# Patient Record
Sex: Male | Born: 1980 | Race: White | Hispanic: No | Marital: Married | Smoking: Never smoker
Health system: Southern US, Community
[De-identification: ages and names within clinical notes are randomized; demographics above are authoritative.]

## PROBLEM LIST (undated history)

## (undated) DIAGNOSIS — Z9189 Other specified personal risk factors, not elsewhere classified: Secondary | ICD-10-CM

## (undated) DIAGNOSIS — Z872 Personal history of diseases of the skin and subcutaneous tissue: Secondary | ICD-10-CM

## (undated) HISTORY — DX: Other specified personal risk factors, not elsewhere classified: Z91.89

## (undated) HISTORY — DX: Personal history of diseases of the skin and subcutaneous tissue: Z87.2

---

## 2006-09-23 ENCOUNTER — Emergency Department (HOSPITAL_COMMUNITY): Admission: EM | Admit: 2006-09-23 | Discharge: 2006-09-24 | Payer: Self-pay | Admitting: Emergency Medicine

## 2006-09-23 ENCOUNTER — Emergency Department (HOSPITAL_COMMUNITY): Admission: EM | Admit: 2006-09-23 | Discharge: 2006-09-23 | Payer: Self-pay | Admitting: Emergency Medicine

## 2006-09-28 ENCOUNTER — Emergency Department (HOSPITAL_COMMUNITY): Admission: EM | Admit: 2006-09-28 | Discharge: 2006-09-28 | Payer: Self-pay | Admitting: Emergency Medicine

## 2006-09-29 ENCOUNTER — Encounter: Admission: RE | Admit: 2006-09-29 | Discharge: 2006-09-29 | Payer: Self-pay | Admitting: Emergency Medicine

## 2007-03-30 ENCOUNTER — Ambulatory Visit: Payer: Self-pay | Admitting: Internal Medicine

## 2007-03-30 DIAGNOSIS — Z872 Personal history of diseases of the skin and subcutaneous tissue: Secondary | ICD-10-CM

## 2007-03-30 DIAGNOSIS — J329 Chronic sinusitis, unspecified: Secondary | ICD-10-CM | POA: Insufficient documentation

## 2007-03-30 DIAGNOSIS — Z9189 Other specified personal risk factors, not elsewhere classified: Secondary | ICD-10-CM | POA: Insufficient documentation

## 2007-03-30 HISTORY — DX: Other specified personal risk factors, not elsewhere classified: Z91.89

## 2007-03-30 HISTORY — DX: Personal history of diseases of the skin and subcutaneous tissue: Z87.2

## 2007-04-08 ENCOUNTER — Encounter: Payer: Self-pay | Admitting: Internal Medicine

## 2007-06-14 ENCOUNTER — Telehealth: Payer: Self-pay | Admitting: Internal Medicine

## 2007-06-15 ENCOUNTER — Ambulatory Visit: Payer: Self-pay | Admitting: Internal Medicine

## 2007-06-15 DIAGNOSIS — H6980 Other specified disorders of Eustachian tube, unspecified ear: Secondary | ICD-10-CM | POA: Insufficient documentation

## 2007-11-09 ENCOUNTER — Ambulatory Visit: Payer: Self-pay | Admitting: Internal Medicine

## 2007-11-09 DIAGNOSIS — J069 Acute upper respiratory infection, unspecified: Secondary | ICD-10-CM | POA: Insufficient documentation

## 2007-11-09 DIAGNOSIS — J019 Acute sinusitis, unspecified: Secondary | ICD-10-CM | POA: Insufficient documentation

## 2007-12-23 ENCOUNTER — Ambulatory Visit: Payer: Self-pay | Admitting: Internal Medicine

## 2008-05-12 ENCOUNTER — Ambulatory Visit: Payer: Self-pay | Admitting: Internal Medicine

## 2008-05-12 DIAGNOSIS — M542 Cervicalgia: Secondary | ICD-10-CM | POA: Insufficient documentation

## 2008-05-12 DIAGNOSIS — R21 Rash and other nonspecific skin eruption: Secondary | ICD-10-CM | POA: Insufficient documentation

## 2008-09-12 ENCOUNTER — Ambulatory Visit: Payer: Self-pay | Admitting: Internal Medicine

## 2009-05-29 ENCOUNTER — Ambulatory Visit: Payer: Self-pay | Admitting: Internal Medicine

## 2009-07-05 ENCOUNTER — Telehealth: Payer: Self-pay | Admitting: *Deleted

## 2009-08-14 ENCOUNTER — Ambulatory Visit: Payer: Self-pay | Admitting: Internal Medicine

## 2009-08-14 LAB — CONVERTED CEMR LAB
BUN: 14 mg/dL (ref 6–23)
Basophils Absolute: 0 10*3/uL (ref 0.0–0.1)
Bilirubin Urine: NEGATIVE
Blood in Urine, dipstick: NEGATIVE
Chloride: 107 meq/L (ref 96–112)
Cholesterol: 137 mg/dL (ref 0–200)
Eosinophils Absolute: 0.2 10*3/uL (ref 0.0–0.7)
GFR calc non Af Amer: 104.61 mL/min (ref >60)
Glucose, Bld: 90 mg/dL (ref 70–99)
Glucose, Urine, Semiquant: NEGATIVE
HCT: 44.2 % (ref 39.0–52.0)
Lymphs Abs: 1.5 10*3/uL (ref 0.7–4.0)
MCHC: 34.5 g/dL (ref 30.0–36.0)
MCV: 90 fL (ref 78.0–100.0)
Monocytes Absolute: 0.3 10*3/uL (ref 0.1–1.0)
Platelets: 161 10*3/uL (ref 150.0–400.0)
Potassium: 4.4 meq/L (ref 3.5–5.1)
Protein, U semiquant: NEGATIVE
RDW: 13.2 % (ref 11.5–14.6)
TSH: 4.04 microintl units/mL (ref 0.35–5.50)
Total Bilirubin: 0.6 mg/dL (ref 0.3–1.2)
VLDL: 9.6 mg/dL (ref 0.0–40.0)
pH: 5.5

## 2010-04-03 NOTE — Progress Notes (Signed)
Summary: Pt needs cpx labs done  Phone Note Call from Patient Call back at [phone]   Caller: Wife-Trish Summary of Call: Pt's wife called saying that pt needs cpx labs done. I called and left wife a message to have pt call us back with what other labs besides the cpx labs that he needs for his job as well as a date that he can come in for his labs. Per Dr. Fabian Sharp okay to do whatever labs he needs to have done. Initial call taken by: Romualdo Bolk, CMA Duncan Dull),  Jul 05, 2009 11:17 AM  Follow-up for Phone Call        Left message for pt's wife to call us back or have Pt call us back about this. Follow-up by: Romualdo Bolk, CMA Duncan Dull),  Jul 09, 2009 10:48 AM  Additional Follow-up for Phone Call Additional follow up Details #1::        Left msg to call us back with lab needed Additional Follow-up by: Kathrynn Speed CMA,  Jul 16, 2009 1:02 PM    Additional Follow-up for Phone Call Additional follow up Details #2::    Pt's wife called back and said that he just needs reg. cpx labs done. Then he will schedule a cpx in Sept. I called pt's wife back and left her a message saying that would be fine to do. Follow-up by: Romualdo Bolk, CMA (AAMA),  Jul 17, 2009 10:18 AM

## 2010-04-03 NOTE — Assessment & Plan Note (Signed)
Summary: head congestion/ssc   Vital Signs:  Patient profile:   30 year old male Weight:      178 pounds BMI:     24.92 Temp:     98.3 degrees F oral Pulse rate:   78 / minute BP sitting:   120 / 80  (right arm) Cuff size:   regular  Vitals Entered By: Romualdo Bolk, CMA Duncan Dull) (May 29, 2009 2:58 PM) CC: Sore throat started on 3/22 then started coughing and congestion on 3/26. Now still has some sinus congestion   History of Present Illness: Eric Haynes comesin in for  above  Onset   8 days ago with st and then  URi like signs and then very congested in    face pain. slighlty better today . not having to fly for 5 more days but   wishes to rx if poossibel to avoid persistance before  going in air again. taking with sudafed    and using saline irrigations No fever . Hearing  ok stopped up.    Minor cough     Preventive Screening-Counseling & Management  Alcohol-Tobacco     Alcohol drinks/day: <1     Alcohol type: beer, mixed drink     Smoking Status: never  Caffeine-Diet-Exercise     Caffeine use/day: 1-2     Does Patient Exercise: yes     Type of exercise: walking     Times/week: 2  Current Medications (verified): 1)  Valtrex 1 Gm  Tabs (Valacyclovir Hcl) .... 2 By Mouth Two Times A Day As Needed As Directed 2)  Cyclobenzaprine Hcl 10 Mg Tabs (Cyclobenzaprine Hcl) .Marland Kitchen.. 1 By Mouth Three Times A Day  For Muscle Spasm 3)  Flonase 50 Mcg/act Susp (Fluticasone Propionate) .... 2 Sprays Each Nares Q D  Allergies (verified): 1)  ! Amoxicillin  Past History:  Past medical, surgical, family and social histories (including risk factors) reviewed, and no changes noted (except as noted below).  Past Medical History: Reviewed history from 11/09/2007 and no changes required. varicella Mole removal   Past Surgical History: Reviewed history from 03/30/2007 and no changes required. Denies surgical history  Past History:  Care Management: None Current  Family  History: Reviewed history from 05/12/2008 and no changes required. Family History Diabetes 1st degree relative- Maternal Grandmother Family History of Stroke F 1st degree relative Paternal Grandmother  Sibs bro/sis alive and well Parents  no chronic illness. No bone or thyroid disease     Social History: Reviewed history from 09/12/2008 and no changes required. 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  Married   Review of Systems  The patient denies anorexia, fever, weight loss, weight gain, vision loss, hoarseness, chest pain, syncope, dyspnea on exertion, peripheral edema, prolonged cough, hemoptysis, abdominal pain, difficulty walking, unusual weight change, abnormal bleeding, and enlarged lymph nodes.         ears popping  Physical Exam  General:  alert, well-developed, and well-nourished.  congested  Head:  normocephalic and atraumatic.   Eyes:  vision grossly intact, pupils equal, and pupils round.   Ears:  R ear normal, L ear normal, and no external deformities.   Nose:  no external deformity.  congested andincrease turbinates   midly tender  maxillary areas  Mouth:  cobble stoning i  no lesions Neck:  No deformities, masses, or tenderness noted. Lungs:  Normal respiratory effort, chest expands symmetrically. Lungs are clear to auscultation, no crackles or  wheezes.no dullness.   Heart:  normal rate, regular rhythm, and no murmur.   Pulses:  nl cap refill  Extremities:  no clubbing cyanosis or edema  Skin:  turgor normal, color normal, no ecchymoses, and no petechiae.   Cervical Nodes:  No lymphadenopathy noted Psych:  Oriented X3, good eye contact, not anxious appearing, and not depressed appearing.     Impression & Recommendations:  Problem # 1:  SINUSITIS- ACUTE-NOS (ICD-461.9) recurrent   ?  complicated URI   pilot  gets complicated urs  but bettter  bewtween them  disc rotating antibioitics  had se of  amox  thinks omnicef not that helpful in the past. z pack workded better last time used.       ? if allergy  adding.    see past notes   .   Asks about refill on antibiotic  however  disc  risk of this and would have him call  in with recurrence and would consi der empiric rx   if  appropriate  .    The following medications were removed from the medication list:    Cefdinir 300 Mg Caps (Cefdinir) .Marland Kitchen... 1 by mouth two times a day for sinusitis His updated medication list for this problem includes:    Flonase 50 Mcg/act Susp (Fluticasone propionate) .Marland Kitchen... 2 sprays each nares q d    Azithromycin 250 Mg Tabs (Azithromycin) .Marland Kitchen... Take 2 by mouth x 1 then 1 by mouth once daily  for 4 more days.  Complete Medication List: 1)  Valtrex 1 Gm Tabs (Valacyclovir hcl) .... 2 by mouth two times a day as needed as directed 2)  Cyclobenzaprine Hcl 10 Mg Tabs (Cyclobenzaprine hcl) .Marland Kitchen.. 1 by mouth three times a day  for muscle spasm 3)  Flonase 50 Mcg/act Susp (Fluticasone propionate) .... 2 sprays each nares q d 4)  Azithromycin 250 Mg Tabs (Azithromycin) .... Take 2 by mouth x 1 then 1 by mouth once daily  for 4 more days.  Patient Instructions: 1)  continue irrigation  saline and  consider restarting  flonase 2)  add antibioitc  3)  call if recurrent   Prescriptions: AZITHROMYCIN 250 MG TABS (AZITHROMYCIN) take 2 by mouth x 1 then 1 by mouth once daily  for 4 more days.  #6 x 0   Entered and Authorized by:   Madelin Headings MD   Signed by:   Madelin Headings MD on 05/29/2009   Method used:   Electronically to        [facility] [address] (retail)       [address]       [place], [place]  [address]       Ph: [phone]       Fax: [phone]   RxID:   [id number]

## 2010-12-16 LAB — CSF CELL COUNT WITH DIFFERENTIAL
RBC Count, CSF: 0
Tube #: 4
WBC, CSF: 1

## 2010-12-16 LAB — GRAM STAIN

## 2010-12-16 LAB — I-STAT 8, (EC8 V) (CONVERTED LAB)
Acid-base deficit: 1
Chloride: 101
Glucose, Bld: 182 — ABNORMAL HIGH
Hemoglobin: 13.3
Potassium: 3.7
Sodium: 139
pH, Ven: 7.356 — ABNORMAL HIGH

## 2010-12-16 LAB — DIFFERENTIAL
Eosinophils Relative: 2
Lymphocytes Relative: 50 — ABNORMAL HIGH
Lymphs Abs: 1.9
Monocytes Absolute: 0.4
Neutro Abs: 1.4 — ABNORMAL LOW

## 2010-12-16 LAB — POCT I-STAT CREATININE: Operator id: 272551

## 2010-12-16 LAB — CSF CULTURE W GRAM STAIN

## 2010-12-16 LAB — PROTEIN, CSF: Total  Protein, CSF: 46 — ABNORMAL HIGH

## 2010-12-16 LAB — CBC
HCT: 37.8 — ABNORMAL LOW
Hemoglobin: 13.2
WBC: 3.7 — ABNORMAL LOW

## 2010-12-16 LAB — GLUCOSE, CSF: Glucose, CSF: 72

## 2011-01-07 ENCOUNTER — Telehealth: Payer: Self-pay | Admitting: Internal Medicine

## 2011-01-07 MED ORDER — AZITHROMYCIN 250 MG PO TABS: 250.0000 mg | ORAL_TABLET | ORAL | Status: DC

## 2011-01-22 NOTE — Telephone Encounter (Signed)
Examined patient when in for visit with his son .  Prolonged uri with cough and drainage. 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

## 2011-05-16 ENCOUNTER — Telehealth: Payer: Self-pay | Admitting: *Deleted

## 2011-05-16 MED ORDER — AZITHROMYCIN 250 MG PO TABS: 250.0000 mg | ORAL_TABLET | ORAL | Status: DC

## 2011-05-16 NOTE — Telephone Encounter (Signed)
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  Discussed risk benefit of this he is aware printed prescription given.

## 2011-09-26 ENCOUNTER — Encounter: Payer: Self-pay | Admitting: Internal Medicine

## 2011-09-26 ENCOUNTER — Ambulatory Visit (INDEPENDENT_AMBULATORY_CARE_PROVIDER_SITE_OTHER): Payer: Managed Care, Other (non HMO) | Admitting: Internal Medicine

## 2011-09-26 VITALS — BP 124/90 | HR 121 | Temp 98.5°F | Wt 187.0 lb

## 2011-09-26 DIAGNOSIS — T7020XA Unspecified effects of high altitude, initial encounter: Secondary | ICD-10-CM

## 2011-09-26 DIAGNOSIS — IMO0001 Reserved for inherently not codable concepts without codable children: Secondary | ICD-10-CM

## 2011-09-26 DIAGNOSIS — J019 Acute sinusitis, unspecified: Secondary | ICD-10-CM

## 2011-09-26 DIAGNOSIS — H6980 Other specified disorders of Eustachian tube, unspecified ear: Secondary | ICD-10-CM

## 2011-09-26 DIAGNOSIS — H6990 Unspecified Eustachian tube disorder, unspecified ear: Secondary | ICD-10-CM

## 2011-09-26 MED ORDER — CEFUROXIME AXETIL 500 MG PO TABS: 500.0000 mg | ORAL_TABLET | Freq: Two times a day (BID) | ORAL | Status: AC

## 2011-09-26 NOTE — Patient Instructions (Signed)
You have an upper respiratory infection with some sinusitis and eustachian tube dysfunction aggravated by altitude changes. There is fluid in her middle ear . Add antibiotic for possible bacterial sinusitis Samples of on naris which is a nasal cortisone spray take 2 sprays each side of the nose once a day continue saline nose spray and decongestants.  Expect significant improvement in the next 2-3 days.

## 2011-09-26 NOTE — Progress Notes (Signed)
  Subjective:    Patient ID: Eric Haynes, male    DOB: 1980-10-16, 31 y.o.   MRN: [id number]  HPI Patient comes in today for SDA for  new problem evaluation. Onset of raw sore throat about 4 days ago and then some nasal congestion had to fly season Pilate d. Having increasing nasal congestion discharge nasal and postnasal drainage coughing up green phlegm with specks of red and blood and decreased hearing. On the last flight his right ear has still not decompressed  equilibrated. Has some cough but no shortness of breath.  No fever or severe headache but very clogged has been using a decongestant. He has been on the ground for more than a day.  Review of Systems No fever chills sob wheezing or new headache.  Past history family history social history reviewed in the electronic medical record.    Objective:   Physical Exam BP 124/90  Pulse 121  Temp 98.5 F (36.9 C) (Oral)  Wt 187 lb (84.823 kg)  SpO2 98% WDWN in NAD  quiet respirations; very congested  somewhat hoarse. Non toxic . HEENT: Normocephalic ;atraumatic , Eyes;  PERRL, EOMs  Full, lids and conjunctiva clear,,Ears: no deformities, canals nl, TM landmarks normal left clear dusky fluid  Right retracted , Nose: no deformity mucoid dc but congested;face minimally tender Mouth : OP clear without lesion or edema . 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  Add omnaris samples nasal spray in addition to saline nose spray and decongestants esp pre flight .   Hx of rash with amox.  Reviewed record seems to be able to take cephalosporin.

## 2011-11-13 ENCOUNTER — Ambulatory Visit (INDEPENDENT_AMBULATORY_CARE_PROVIDER_SITE_OTHER): Payer: Managed Care, Other (non HMO)

## 2011-11-13 ENCOUNTER — Ambulatory Visit: Payer: Managed Care, Other (non HMO) | Admitting: Family Medicine

## 2011-11-13 DIAGNOSIS — Z23 Encounter for immunization: Secondary | ICD-10-CM

## 2011-12-27 ENCOUNTER — Encounter: Payer: Self-pay | Admitting: Family Medicine

## 2011-12-27 ENCOUNTER — Ambulatory Visit (INDEPENDENT_AMBULATORY_CARE_PROVIDER_SITE_OTHER): Payer: Managed Care, Other (non HMO) | Admitting: Family Medicine

## 2011-12-27 VITALS — BP 120/80 | HR 68 | Temp 98.7°F | Wt 188.0 lb

## 2011-12-27 DIAGNOSIS — J019 Acute sinusitis, unspecified: Secondary | ICD-10-CM

## 2011-12-27 MED ORDER — FLUTICASONE PROPIONATE 50 MCG/ACT NA SUSP: 2.0000 | Freq: Every day | NASAL | Status: DC

## 2011-12-27 MED ORDER — AZITHROMYCIN 250 MG PO TABS: ORAL_TABLET | ORAL | Status: DC

## 2011-12-27 NOTE — Progress Notes (Signed)
SUBJECTIVE:  Eric Haynes is a 31 y.o. male who complains of coryza, congestion, sneezing, sore throat and bilateral sinus pain for 5 days. He denies a history of anorexia and chest pain and denies a history of asthma. Patient denies smoke cigarettes.  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  H/o recurrent sinusitis and ETD.  Afebrile.  Patient Active Problem List  Diagnosis  . EUSTACHIAN TUBE DYSFUNCTION  . SINUSITIS- ACUTE-NOS  . URI  . UNSPECIFIED SINUSITIS  . NECK AND BACK PAIN  . RASH AND OTHER NONSPECIFIC SKIN ERUPTION  . HERPES LABIALIS, HX OF  . CHICKENPOX, HX OF  . Barotrauma mechanism   Past Medical History  Diagnosis Date  . CHICKENPOX, HX OF 03/30/2007    Qualifier: Diagnosis of  By: Lawernce Ion, CMA (AAMA), Bethann Berkshire   . HERPES LABIALIS, HX OF 03/30/2007    Qualifier: Diagnosis of  By: Fabian Sharp MD, Neta Mends    No past surgical history on file. History  Substance Use Topics  . Smoking status: Never Smoker   . Smokeless tobacco: Not on file  . Alcohol Use: Not on file   No family history on file. Allergies  Allergen Reactions  . Amoxicillin     REACTION: rash   Current Outpatient Prescriptions on File Prior to Visit  Medication Sig Dispense Refill  . pseudoephedrine (SUDAFED) 120 MG 12 hr tablet Take 120 mg by mouth every 12 (twelve) hours.      . fluticasone (FLONASE) 50 MCG/ACT nasal spray Place 2 sprays into the nose daily.  16 g  6   The PMH, PSH, Social History, Family History, Medications, and allergies have been reviewed in [facility], and have been updated if relevant.    OBJECTIVE: BP 120/80  Pulse 68  Temp 98.7 F (37.1 C) (Oral)  Wt 188 lb (85.276 kg)  He appears well, vital signs are as noted. Ears normal.  Throat and pharynx normal.  Neck supple. No adenopathy in the neck. Nose is congested. Sinuses  tender. The chest is clear, without wheezes or rales.  ASSESSMENT:  sinusitis  PLAN: Start Flonase, zpack as directed. Symptomatic  therapy suggested: push fluids, rest and return office visit prn if symptoms persist or worsen.  Call or return to clinic prn if these symptoms worsen or fail to improve as anticipated.

## 2011-12-27 NOTE — Patient Instructions (Addendum)
Take zpack Drink lots of fluids.  Treat sympotmatically with Mucinex, nasal saline irrigation, and Tylenol/Ibuprofen. Also try claritin D or zyrtec D over the counter- two times a day as needed ( have to sign for them at pharmacy). You can use warm compresses.  Cough suppressant at night. Call if not improving as expected in 5-7 days.

## 2012-07-01 ENCOUNTER — Ambulatory Visit (INDEPENDENT_AMBULATORY_CARE_PROVIDER_SITE_OTHER): Payer: Managed Care, Other (non HMO) | Admitting: Internal Medicine

## 2012-07-01 ENCOUNTER — Encounter: Payer: Self-pay | Admitting: Internal Medicine

## 2012-07-01 VITALS — BP 122/76 | HR 87 | Temp 98.7°F | Wt 192.0 lb

## 2012-07-01 DIAGNOSIS — J0191 Acute recurrent sinusitis, unspecified: Secondary | ICD-10-CM | POA: Insufficient documentation

## 2012-07-01 DIAGNOSIS — M542 Cervicalgia: Secondary | ICD-10-CM

## 2012-07-01 DIAGNOSIS — D239 Other benign neoplasm of skin, unspecified: Secondary | ICD-10-CM

## 2012-07-01 DIAGNOSIS — J019 Acute sinusitis, unspecified: Secondary | ICD-10-CM

## 2012-07-01 DIAGNOSIS — D229 Melanocytic nevi, unspecified: Secondary | ICD-10-CM

## 2012-07-01 MED ORDER — AZITHROMYCIN 250 MG PO TABS: ORAL_TABLET | ORAL | Status: DC

## 2012-07-01 MED ORDER — PREDNISONE 20 MG PO TABS: ORAL_TABLET | ORAL | Status: DC

## 2012-07-01 NOTE — Progress Notes (Signed)
Chief Complaint  Patient presents with  . Cough    Started on Saturday.  Taking Sudafed and Mucinex OTC.  Marland Kitchen Sore Throat  . Headache  . Nasal Congestion    HPI: Patient comes in today for SDA for  new problem evaluation. 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 If atypical celds some pigment at base  Other mole is becoming raised but no color change   ROS: See pertinent positives and negatives per HPI.cp but but wheezing cp sob   Past Medical History  Diagnosis Date  . CHICKENPOX, HX OF 03/30/2007    Qualifier: Diagnosis of  By: Lawernce Ion, CMA (AAMA), Bethann Berkshire   . HERPES LABIALIS, HX OF 03/30/2007    Qualifier: Diagnosis of  By: Fabian Sharp MD, Neta Mends     No family history on file.  History   Social History  . Marital Status: Single    Spouse Name: N/A    Number of Children: N/A  . Years of Education: N/A   Social History Main Topics  . Smoking status: Never Smoker   . Smokeless tobacco: None  . Alcohol Use: None  . Drug Use: None  . Sexually Active: None   Other Topics Concern  . None   Social History Scientist, water quality   Hh of 3 married  Toddler in day care.    No ets.     Outpatient Encounter Prescriptions as of 07/01/2012  Medication Sig Dispense Refill  . pseudoephedrine (SUDAFED) 120 MG 12 hr tablet Take 120 mg by mouth every 12 (twelve) hours.      Marland Kitchen azithromycin (ZITHROMAX Z-PAK) 250 MG tablet Take 2 po first day, then 1 po qd days 2-5  6 each  0  . predniSONE (DELTASONE) 20 MG tablet Take 3 po qd for 2 days  then 2 po qd for 3 days,or as directed  12 tablet  0  . [DISCONTINUED] azithromycin (ZITHROMAX Z-PAK) 250 MG tablet Take 2 po first day, then 1 po qd days 2-5  6 each  0  . [DISCONTINUED] fluticasone (FLONASE) 50 MCG/ACT nasal spray Place 2 sprays into the nose daily.  16 g  6   No facility-administered encounter medications on file as of 07/01/2012.    EXAM:  BP 122/76  Pulse 87  Temp(Src) 98.7 F (37.1 C) (Oral)  Wt 192 lb (87.091 kg)  BMI 26.79 kg/m2  SpO2 98%  Body mass index is 26.79 kg/(m^2). WDWN in NAD  quiet respirations; moderately congested  . Non toxic . HEENT: Normocephalic ;atraumatic , Eyes;  PERRL, EOMs  Full, lids and conjunctiva clear,,Ears: no deformities, canals nl, TM landmarks normal, right left has fullness and cloudy  Nl bony lm  Nose: no deformity or discharge but congested;face minimally tender maxillary  Mouth : OP clear without lesion or edema . Neck: Supple without adenopathy or masses or bruits rom nl  some foreshortening in posture  Chest:  Clear to A&P without wheezes rales or rhonchi CV:  S1-S2 no gallops or murmurs peripheral perfusion is normal Skin :nl perfusion and no acute rashes    lef forearm  With samll 2 mm brown mo;e raised regular    PSYCH: pleasant and cooperative, no obvious depression or anxiety  ASSESSMENT AND PLAN:  Discussed the following assessment and plan:  Recurrent acute sinusitis - somewhat seasonal  problematic with baropressure issues with occupation  Change in skin mole per report  - looks benign by inspection  track and if changing over time recheck remove or der as appropriate   Neck pain - ms aggravated by job  reviewed poss exercises and can fu if progressive no alarm features Seems to respond to z pack   Risk benefit of medication discussed. And issues with  Resistance   pred if needed Patterns for ow ok to use  With discussion .   Expectant management. Still consider incs in season  If this seems to be seasonal  Does  have a young child at home in day care .  Disc neck posture and exercise possibility  -Patient advised to return or notify health care team  if symptoms worsen or persist or new concerns arise.  Patient Instructions  Sinusitis  treatment . As we discussed  Neck exercises as discussed  Take a picture of mole and follow .    Neta Mends. Lilo Wallington M.D.

## 2012-07-01 NOTE — Patient Instructions (Signed)
Sinusitis  treatment . As we discussed  Neck exercises as discussed  Take a picture of mole and follow .

## 2012-12-21 ENCOUNTER — Ambulatory Visit (INDEPENDENT_AMBULATORY_CARE_PROVIDER_SITE_OTHER): Payer: Managed Care, Other (non HMO)

## 2012-12-21 DIAGNOSIS — Z23 Encounter for immunization: Secondary | ICD-10-CM

## 2013-12-27 ENCOUNTER — Ambulatory Visit (INDEPENDENT_AMBULATORY_CARE_PROVIDER_SITE_OTHER): Payer: Managed Care, Other (non HMO)

## 2013-12-27 DIAGNOSIS — Z23 Encounter for immunization: Secondary | ICD-10-CM

## 2014-04-19 ENCOUNTER — Ambulatory Visit (INDEPENDENT_AMBULATORY_CARE_PROVIDER_SITE_OTHER): Payer: Managed Care, Other (non HMO) | Admitting: Family

## 2014-04-19 ENCOUNTER — Encounter: Payer: Self-pay | Admitting: Family

## 2014-04-19 VITALS — BP 110/80 | Temp 97.8°F | Wt 195.4 lb

## 2014-04-19 DIAGNOSIS — J069 Acute upper respiratory infection, unspecified: Secondary | ICD-10-CM

## 2014-04-19 MED ORDER — AZITHROMYCIN 250 MG PO TABS: ORAL_TABLET | ORAL | Status: DC

## 2014-04-19 NOTE — Progress Notes (Signed)
Subjective:    Patient ID: Eric Haynes, male    DOB: 1980-09-13, 34 y.o.   MRN: [id number]  HPI 34 y.o. White male presents today with chief complaint of head congestion, sinus pain and feeling like he ruptured his left ear drum. 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 Ear drum started to hurt yesterday, pt states he had a sharp pain, felt his ear and had a small amount of blood; the pain stopped quickly. Pt denies any further discharge or crusting. Denies dizziness. Pt states he has had headache and head fullness for 2 days, also has post nasal drip and sinus pain. Pt states that he could not go to work due to the sinus pain and head pressure. Denies fever, chills, SOB and change in appetite.   Past Medical History  Diagnosis Date  . CHICKENPOX, HX OF 03/30/2007    Qualifier: Diagnosis of  By: Hulan Saas, CMA (AAMA), Quita Skye   . HERPES LABIALIS, HX OF 03/30/2007    Qualifier: Diagnosis of  By: Regis Bill MD, Standley Brooking     History   Social History  . Marital Status: Single    Spouse Name: N/A  . Number of Children: N/A  . Years of Education: N/A   Occupational History  . Not on file.   Social History Main Topics  . Smoking status: Never Smoker   . Smokeless tobacco: Not on file  . Alcohol Use: Not on file  . Drug Use: Not on file  . Sexual Activity: Not on file   Other Topics Concern  . 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    No ets.     No past surgical history on file.  No family history on file.  Allergies  Allergen Reactions  . Amoxicillin     REACTION: rash    Current Outpatient Prescriptions on File Prior to Visit  Medication Sig Dispense Refill  . pseudoephedrine (SUDAFED) 120 MG 12 hr tablet Take 120 mg by mouth every 12 (twelve) hours.     No current facility-administered medications on file prior to visit.    BP 110/80 mmHg  Temp(Src) 97.8 F (36.6 C) (Oral)  Wt 195 lb 6.4 oz  (88.633 kg)chart  Review of Systems  Constitutional: Positive for fatigue. Negative for fever, chills and appetite change.  HENT: Positive for congestion, ear discharge, ear pain, postnasal drip and sinus pressure.   Eyes: Negative.   Respiratory: Negative.  Negative for cough and shortness of breath.   Cardiovascular: Negative.  Negative for chest pain.  Allergic/Immunologic: Negative.        Objective:   Physical Exam  Constitutional: He is oriented to person, place, and time. He appears well-developed and well-nourished. He is active.  HENT:  Head: Normocephalic.  Right Ear: Tympanic membrane, external ear and ear canal normal.  Left Ear: Tympanic membrane, external ear and ear canal normal.  Nose: Rhinorrhea present. Right sinus exhibits maxillary sinus tenderness. Left sinus exhibits maxillary sinus tenderness.  Mouth/Throat: Uvula is midline and mucous membranes are normal. Posterior oropharyngeal erythema present.  Eyes: Conjunctivae are normal.  Cardiovascular: Normal rate, regular rhythm, normal heart sounds and normal pulses.   Pulmonary/Chest: Effort normal and breath sounds normal.  Neurological: He is alert and oriented to person, place, and time.          Assessment & Plan:  Bertram was  seen today for head congesiton.  Diagnoses and all orders for this visit:  Acute upper respiratory infection  Other orders -     Discontinue: azithromycin (ZITHROMAX) 250 MG tablet; Take two pills the first day, then once per day for total of 5 days. -     azithromycin (ZITHROMAX) 250 MG tablet; Take two pills by mouth the first day, then one pill, once per day for total of 5 days.

## 2014-04-19 NOTE — Progress Notes (Signed)
Pre visit review using our clinic review tool, if applicable. No additional management support is needed unless otherwise documented below in the visit note. 

## 2014-04-19 NOTE — Patient Instructions (Signed)
Take Azithromycin, 2 pills by mouth today and then one pill, once per day for four days. Total of 5 days.   Upper Respiratory Infection, Adult An upper respiratory infection (URI) is also sometimes known as the common cold. The upper respiratory tract includes the nose, sinuses, throat, trachea, and bronchi. Bronchi are the airways leading to the lungs. Most people improve within 1 week, but symptoms can last up to 2 weeks. A residual cough may last even longer.  CAUSES Many different viruses can infect the tissues lining the upper respiratory tract. The tissues become irritated and inflamed and often become very moist. Mucus production is also common. A cold is contagious. You can easily spread the virus to others by oral contact. This includes kissing, sharing a glass, coughing, or sneezing. Touching your mouth or nose and then touching a surface, which is then touched by another person, can also spread the virus. SYMPTOMS  Symptoms typically develop 1 to 3 days after you come in contact with a cold virus. Symptoms vary from person to person. They may include:  Runny nose.  Sneezing.  Nasal congestion.  Sinus irritation.  Sore throat.  Loss of voice (laryngitis).  Cough.  Fatigue.  Muscle aches.  Loss of appetite.  Headache.  Low-grade fever. DIAGNOSIS  You might diagnose your own cold based on familiar symptoms, since most people get a cold 2 to 3 times a year. Your caregiver can confirm this based on your exam. Most importantly, your caregiver can check that your symptoms are not due to another disease such as strep throat, sinusitis, pneumonia, asthma, or epiglottitis. Blood tests, throat tests, and X-rays are not necessary to diagnose a common cold, but they may sometimes be helpful in excluding other more serious diseases. Your caregiver will decide if any further tests are required. RISKS AND COMPLICATIONS  You may be at risk for a more severe case of the common cold if you  smoke cigarettes, have chronic heart disease (such as heart failure) or lung disease (such as asthma), or if you have a weakened immune system. The very young and very old are also at risk for more serious infections. Bacterial sinusitis, middle ear infections, and bacterial pneumonia can complicate the common cold. The common cold can worsen asthma and chronic obstructive pulmonary disease (COPD). Sometimes, these complications can require emergency medical care and may be life-threatening. PREVENTION  The best way to protect against getting a cold is to practice good hygiene. Avoid oral or hand contact with people with cold symptoms. Wash your hands often if contact occurs. There is no clear evidence that vitamin C, vitamin E, echinacea, or exercise reduces the chance of developing a cold. However, it is always recommended to get plenty of rest and practice good nutrition. TREATMENT  Treatment is directed at relieving symptoms. There is no cure. Antibiotics are not effective, because the infection is caused by a virus, not by bacteria. Treatment may include:  Increased fluid intake. Sports drinks offer valuable electrolytes, sugars, and fluids.  Breathing heated mist or steam (vaporizer or shower).  Eating chicken soup or other clear broths, and maintaining good nutrition.  Getting plenty of rest.  Using gargles or lozenges for comfort.  Controlling fevers with ibuprofen or acetaminophen as directed by your caregiver.  Increasing usage of your inhaler if you have asthma. Zinc gel and zinc lozenges, taken in the first 24 hours of the common cold, can shorten the duration and lessen the severity of symptoms. Pain medicines may  help with fever, muscle aches, and throat pain. A variety of non-prescription medicines are available to treat congestion and runny nose. Your caregiver can make recommendations and may suggest nasal or lung inhalers for other symptoms.  HOME CARE INSTRUCTIONS   Only take  over-the-counter or prescription medicines for pain, discomfort, or fever as directed by your caregiver.  Use a warm mist humidifier or inhale steam from a shower to increase air moisture. This may keep secretions moist and make it easier to breathe.  Drink enough water and fluids to keep your urine clear or pale yellow.  Rest as needed.  Return to work when your temperature has returned to normal or as your caregiver advises. You may need to stay home longer to avoid infecting others. You can also use a face mask and careful hand washing to prevent spread of the virus. SEEK MEDICAL CARE IF:   After the first few days, you feel you are getting worse rather than better.  You need your caregiver's advice about medicines to control symptoms.  You develop chills, worsening shortness of breath, or brown or red sputum. These may be signs of pneumonia.  You develop yellow or brown nasal discharge or pain in the face, especially when you bend forward. These may be signs of sinusitis.  You develop a fever, swollen neck glands, pain with swallowing, or white areas in the back of your throat. These may be signs of strep throat. SEEK IMMEDIATE MEDICAL CARE IF:   You have a fever.  You develop severe or persistent headache, ear pain, sinus pain, or chest pain.  You develop wheezing, a prolonged cough, cough up blood, or have a change in your usual mucus (if you have chronic lung disease).  You develop sore muscles or a stiff neck. Document Released: 08/13/2000 Document Revised: 05/12/2011 Document Reviewed: 05/25/2013 [facility] Patient Information 2015 [place], [place]. This information is not intended to replace advice given to you by your health care provider. Make sure you discuss any questions you have with your health care provider.

## 2014-04-25 ENCOUNTER — Other Ambulatory Visit: Payer: Self-pay | Admitting: *Deleted

## 2014-04-25 ENCOUNTER — Telehealth: Payer: Self-pay | Admitting: Internal Medicine

## 2014-04-25 ENCOUNTER — Encounter: Payer: Self-pay | Admitting: Internal Medicine

## 2014-04-25 ENCOUNTER — Encounter: Payer: Self-pay | Admitting: *Deleted

## 2014-04-25 NOTE — Telephone Encounter (Signed)
See previous note

## 2014-04-25 NOTE — Telephone Encounter (Signed)
Pt saw Dr. Megan Salon on Wednesday 04/19/2014 for an appointment and pt came in today wanting a doctor note to return back to work.

## 2014-04-25 NOTE — Progress Notes (Signed)
   Subjective:    Patient ID: Eric Haynes, male    DOB: 1980/06/25, 34 y.o.   MRN: [id number]  HPI    Review of Systems     Objective:   Physical Exam        Assessment & Plan:  Patient walked in for note. Patient seen on 04/19/2014 and requesting to return back to work 04/26/2014. Spoke with MD Panosh and advised okay to give note to return to work if patient is feeling better. Assessed patient's general appearance and overall seemed well. Vitals signs 148/86 HR 82 RR 20 99% on RA. Also educated patient if symptoms were to reoccur to not hesitate to be seen. Note given. Followed up with MD Panosh to update of vital signs and encounter.

## 2014-04-25 NOTE — Progress Notes (Signed)
Opened in error

## 2014-04-26 NOTE — Telephone Encounter (Signed)
Review of letters shows that letter was printed by Sharyn Lull and signed by Dr. Regis Bill

## 2015-01-22 ENCOUNTER — Ambulatory Visit (INDEPENDENT_AMBULATORY_CARE_PROVIDER_SITE_OTHER): Payer: Managed Care, Other (non HMO) | Admitting: Internal Medicine

## 2015-01-22 ENCOUNTER — Encounter: Payer: Self-pay | Admitting: Internal Medicine

## 2015-01-22 VITALS — BP 120/78 | Temp 97.7°F | Wt 197.0 lb

## 2015-01-22 DIAGNOSIS — H6982 Other specified disorders of Eustachian tube, left ear: Secondary | ICD-10-CM

## 2015-01-22 DIAGNOSIS — Z23 Encounter for immunization: Secondary | ICD-10-CM | POA: Diagnosis not present

## 2015-01-22 DIAGNOSIS — J329 Chronic sinusitis, unspecified: Secondary | ICD-10-CM

## 2015-01-22 DIAGNOSIS — J069 Acute upper respiratory infection, unspecified: Secondary | ICD-10-CM

## 2015-01-22 NOTE — Progress Notes (Signed)
Pre visit review using our clinic review tool, if applicable. No additional management support is needed unless otherwise documented below in the visit note.  Chief Complaint  Patient presents with  . Nasal Congestion    X1wk.  Cough and ear pain are better.  . Cough  . Headache  . Ear Pain    HPI: Eric Haynes 34 y.o. comes in for  resp infection evaluation and fmla form for work. 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  Well in between  . Had illness and had to stop end October in [place] .  Now recently has had   Colds  And right ear pain for a week 8 - 9 days    Last fly4 days ago .  Cough no fever or dizziness.   This illness is getting better  On its own  Has hx of sinusitis and antibiotic given .   Small children at home so exposed to uris also  ROS: See pertinent positives and negatives per HPI. No cp sob hemoptysis dizziness neuro sx  Generally well otherwise  Takes measures to mitigate congestion  Past Medical History  Diagnosis Date  . CHICKENPOX, HX OF 03/30/2007    Qualifier: Diagnosis of  By: Hulan Saas, CMA (AAMA), Quita Skye   . HERPES LABIALIS, HX OF 03/30/2007    Qualifier: Diagnosis of  By: Regis Bill MD, Standley Brooking     No family history on file.  Social History   Social History  . Marital Status: Single    Spouse Name: N/A  . Number of Children: N/A  . Years of Education: N/A   Social History Main Topics  . Smoking status: Never Smoker   . Smokeless tobacco: None  . Alcohol Use: None  . Drug Use: None  . Sexual Activity: Not Asked   Other Topics Concern  . 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    No ets.     Outpatient Prescriptions Prior to Visit  Medication Sig Dispense Refill  . azithromycin (ZITHROMAX) 250 MG tablet Take two pills by mouth the first day, then one pill, once per day for total of 5 days. 6 tablet 0  . pseudoephedrine (SUDAFED) 120 MG 12 hr tablet Take 120 mg  by mouth every 12 (twelve) hours.     No facility-administered medications prior to visit.     EXAM:  BP 120/78 mmHg  Temp(Src) 97.7 F (36.5 C) (Oral)  Wt 197 lb (89.359 kg)  Body mass index is 27.49 kg/(m^2).  GENERAL: vitals reviewed and listed above, alert, oriented, appears well hydrated and in no acute distress mild ocngestion  HEENT: atraumatic, conjunctiva  clear, no obvious abnormalities on inspection of external nose and ears   tms  No acute bulging ? Clear fluid  Bony lm nl face non tender  midl nasal congestoin  OP : no lesion edema or exudate  NECK: no obvious masses on inspection palpation  LUNGS: clear to auscultation bilaterally, no wheezes, rales or rhonchi, good air movement CV: HRRR, no clubbing cyanosis or  peripheral edema nl cap refill  MS: moves all extremities without noticeable focal  abnormality PSYCH: pleasant and cooperative, no obvious depression or anxiety  ASSESSMENT AND PLAN:  Discussed the following assessment and plan:  URI, acute  Dysfunction of left Eustachian tube - gettin better  no oither intervention  at this time  Recurrent sinus infections - and uris with ear sx . fmla form gets about 3-4 x per year and has to not fly at that time.   Need for prophylactic vaccination and inoculation against influenza - Plan: Flu Vaccine QUAD 36+ mos PF IM (Fluarix & Fluzone Quad PF)   Expectant management. And plan for alarm fu.   fmla  Can go back nov 30th    -Patient advised to return or notify health care team  if symptoms worsen ,persist or new concerns arise. Total visit 74mins > 50% spent counseling and coordinating care as indicated in above note and in instructions to patient .  fmla etc  Patient Instructions  I  will fill out the FMLA .  Ok to go back to flying end of november as you planned .        Standley Brooking. Paulette Lynch M.D.

## 2015-01-22 NOTE — Patient Instructions (Signed)
I  will fill out the FMLA .  Ok to go back to flying end of november as you planned .

## 2015-01-29 ENCOUNTER — Encounter: Payer: Self-pay | Admitting: Internal Medicine

## 2015-01-29 ENCOUNTER — Ambulatory Visit (INDEPENDENT_AMBULATORY_CARE_PROVIDER_SITE_OTHER): Payer: Managed Care, Other (non HMO) | Admitting: Internal Medicine

## 2015-01-29 VITALS — BP 116/80 | Temp 98.0°F | Wt 198.0 lb

## 2015-01-29 DIAGNOSIS — B079 Viral wart, unspecified: Secondary | ICD-10-CM

## 2015-01-29 NOTE — Patient Instructions (Addendum)
After  Inflammation resolved in a few weeks then add nocutural otc  Wart topical  For a few more weeks till all visibly  gone .  Fu if needed.

## 2015-01-29 NOTE — Progress Notes (Signed)
Pre visit review using our clinic review tool, if applicable. No additional management support is needed unless otherwise documented below in the visit note.   Chief Complaint  Patient presents with  . Wart Removal    HPI: Eric Haynes 34 y.o. comes in because of  Ongoing wart right middle finger   Palmar surface for" a long tim" looked like cauliflower and used otc freeze afer weeks ago  Top finally fell off but  Warty area remains .  No other rx . ( here with daughter for sick visit)  ROS: See pertinent positives and negatives per HPI.  Past Medical History  Diagnosis Date  . CHICKENPOX, HX OF 03/30/2007    Qualifier: Diagnosis of  By: Hulan Saas, CMA (AAMA), Quita Skye   . HERPES LABIALIS, HX OF 03/30/2007    Qualifier: Diagnosis of  By: Regis Bill MD, Standley Brooking     No family history on file.  Social History   Social History  . Marital Status: Single    Spouse Name: N/A  . Number of Children: N/A  . Years of Education: N/A   Social History Main Topics  . Smoking status: Never Smoker   . Smokeless tobacco: None  . Alcohol Use: None  . Drug Use: None  . Sexual Activity: Not Asked   Other Topics Concern  . 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    No ets.     No outpatient prescriptions prior to visit.   No facility-administered medications prior to visit.     EXAM:  BP 116/80 mmHg  Temp(Src) 98 F (36.7 C) (Oral)  Wt 198 lb (89.812 kg)  Body mass index is 27.63 kg/(m^2).  GENERAL: vitals reviewed and listed above, alert, oriented, appears well hydrated and in no acute distress Right  Middle finger   Distal a flat warty  3 mm area  Palmar surface  After disc and  Verbal go ahead risk benefit  frozen x 2 liquid nitrogen    Covered with bandaid and advised local care . Keep covered and avoid trauma etc .  ASSESSMENT AND PLAN:  Discussed the following assessment and plan:  Viral wart on finger   Expectant management. See  instructions .  FMLA form given copy from last  visit resp sx much better -Patient advised to return or notify health care team  if symptoms worsen ,persist or new concerns arise.  Patient Instructions  After  Inflammation resolved in a few weeks then add nocutural otc  Wart topical  For a few more weeks till all visibly  gone .  Fu if needed.    Standley Brooking. Panosh M.D.

## 2015-01-30 ENCOUNTER — Telehealth: Payer: Self-pay | Admitting: Family Medicine

## 2015-01-30 NOTE — Telephone Encounter (Signed)
Eric Haynes from [facility]) called and left a message on my machine.  Stated she had a question about the FMLA paper work that was faxed on 01/29/2015.  Asked for a return call.

## 2015-01-31 NOTE — Telephone Encounter (Signed)
Spoke to Rowan at [facility].  She had questions on # 4,8,13.  Advised that I will speak to Dr. Regis Bill and call back.

## 2015-02-01 NOTE — Telephone Encounter (Signed)
Spoke to West at [employer] on [employer] paper work.

## 2015-06-05 ENCOUNTER — Ambulatory Visit (INDEPENDENT_AMBULATORY_CARE_PROVIDER_SITE_OTHER): Payer: Managed Care, Other (non HMO) | Admitting: Adult Health

## 2015-06-05 ENCOUNTER — Encounter: Payer: Self-pay | Admitting: Adult Health

## 2015-06-05 VITALS — BP 112/70 | Temp 99.5°F | Ht 71.0 in | Wt 199.5 lb

## 2015-06-05 DIAGNOSIS — J069 Acute upper respiratory infection, unspecified: Secondary | ICD-10-CM | POA: Diagnosis not present

## 2015-06-05 MED ORDER — DOXYCYCLINE HYCLATE 100 MG PO CAPS: 100.0000 mg | ORAL_CAPSULE | Freq: Two times a day (BID) | ORAL | Status: DC

## 2015-06-05 MED ORDER — AZITHROMYCIN 250 MG PO TABS: ORAL_TABLET | ORAL | Status: DC

## 2015-06-05 NOTE — Patient Instructions (Addendum)
It was great meeting you again!  I have sent in a prescription for Doxycycline. Please take twice a day for 10 days.   If that does not work, then use the Z pack.  Also use  Flonase.   Follow up if no improvement.     Upper Respiratory Infection, Adult Most upper respiratory infections (URIs) are a viral infection of the air passages leading to the lungs. A URI affects the nose, throat, and upper air passages. The most common type of URI is nasopharyngitis and is typically referred to as "the common cold." URIs run their course and usually go away on their own. Most of the time, a URI does not require medical attention, but sometimes a bacterial infection in the upper airways can follow a viral infection. This is called a secondary infection. Sinus and middle ear infections are common types of secondary upper respiratory infections. Bacterial pneumonia can also complicate a URI. A URI can worsen asthma and chronic obstructive pulmonary disease (COPD). Sometimes, these complications can require emergency medical care and may be life threatening.  CAUSES Almost all URIs are caused by viruses. A virus is a type of germ and can spread from one person to another.  RISKS FACTORS You may be at risk for a URI if:   You smoke.   You have chronic heart or lung disease.  You have a weakened defense (immune) system.   You are very young or very old.   You have nasal allergies or asthma.  You work in crowded or poorly ventilated areas.  You work in health care facilities or schools. SIGNS AND SYMPTOMS  Symptoms typically develop 2-3 days after you come in contact with a cold virus. Most viral URIs last 7-10 days. However, viral URIs from the influenza virus (flu virus) can last 14-18 days and are typically more severe. Symptoms may include:   Runny or stuffy (congested) nose.   Sneezing.   Cough.   Sore throat.   Headache.   Fatigue.   Fever.   Loss of appetite.    Pain in your forehead, behind your eyes, and over your cheekbones (sinus pain).  Muscle aches.  DIAGNOSIS  Your health care provider may diagnose a URI by:  Physical exam.  Tests to check that your symptoms are not due to another condition such as:  Strep throat.  Sinusitis.  Pneumonia.  Asthma. TREATMENT  A URI goes away on its own with time. It cannot be cured with medicines, but medicines may be prescribed or recommended to relieve symptoms. Medicines may help:  Reduce your fever.  Reduce your cough.  Relieve nasal congestion. HOME CARE INSTRUCTIONS   Take medicines only as directed by your health care provider.   Gargle warm saltwater or take cough drops to comfort your throat as directed by your health care provider.  Use a warm mist humidifier or inhale steam from a shower to increase air moisture. This may make it easier to breathe.  Drink enough fluid to keep your urine clear or pale yellow.   Eat soups and other clear broths and maintain good nutrition.   Rest as needed.   Return to work when your temperature has returned to normal or as your health care provider advises. You may need to stay home longer to avoid infecting others. You can also use a face mask and careful hand washing to prevent spread of the virus.  Increase the usage of your inhaler if you have asthma.  Do not use any tobacco products, including cigarettes, chewing tobacco, or electronic cigarettes. If you need help quitting, ask your health care provider. PREVENTION  The best way to protect yourself from getting a cold is to practice good hygiene.   Avoid oral or hand contact with people with cold symptoms.   Wash your hands often if contact occurs.  There is no clear evidence that vitamin C, vitamin E, echinacea, or exercise reduces the chance of developing a cold. However, it is always recommended to get plenty of rest, exercise, and practice good nutrition.  SEEK MEDICAL  CARE IF:   You are getting worse rather than better.   Your symptoms are not controlled by medicine.   You have chills.  You have worsening shortness of breath.  You have brown or red mucus.  You have yellow or brown nasal discharge.  You have pain in your face, especially when you bend forward.  You have a fever.  You have swollen neck glands.  You have pain while swallowing.  You have white areas in the back of your throat. SEEK IMMEDIATE MEDICAL CARE IF:   You have severe or persistent:  Headache.  Ear pain.  Sinus pain.  Chest pain.  You have chronic lung disease and any of the following:  Wheezing.  Prolonged cough.  Coughing up blood.  A change in your usual mucus.  You have a stiff neck.  You have changes in your:  Vision.  Hearing.  Thinking.  Mood. MAKE SURE YOU:   Understand these instructions.  Will watch your condition.  Will get help right away if you are not doing well or get worse.   This information is not intended to replace advice given to you by your health care provider. Make sure you discuss any questions you have with your health care provider.   Document Released: 08/13/2000 Document Revised: 07/04/2014 Document Reviewed: 05/25/2013 Elsevier Interactive Patient Education [employer].

## 2015-06-05 NOTE — Progress Notes (Signed)
Subjective:    Patient ID: Eric Haynes, male    DOB: [birth year], 35 y.o.   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 Reports that he woke up Sunday feeling fatigued at that time he did not have any fevers or chills. Yesterday he came home from work, he is a Insurance underwriter for [employer], when he got home from work she continued to feel fatigued so he laid around the house, that evening he started to have fever and chills. He reports having a fever up to 100F. Today he continues to report slight fevers and just generalized muscle aches  Denies any sick contacts at home  Denies any nausea vomiting or diarrhea  Review of Systems  Constitutional: Positive for fever, chills, activity change, appetite change and fatigue. Negative for diaphoresis.  HENT: Positive for congestion, ear pain, postnasal drip, rhinorrhea and sinus pressure. Negative for ear discharge, sore throat, tinnitus and trouble swallowing.   Respiratory: Negative.   Cardiovascular: Negative.   Musculoskeletal: Positive for myalgias.  Skin: Negative.   Neurological: Negative.   Psychiatric/Behavioral: Negative.    Past Medical History  Diagnosis Date  . CHICKENPOX, HX OF 03/30/2007    Qualifier: Diagnosis of  By: Hulan Saas, CMA (AAMA), Quita Skye   . HERPES LABIALIS, HX OF 03/30/2007    Qualifier: Diagnosis of  By: Regis Bill MD, Standley Brooking     Social History   Social History  . Marital Status: Single    Spouse Name: N/A  . Number of Children: N/A  . Years of Education: N/A   Occupational History  . Not on file.   Social History Main Topics  . Smoking status: Never Smoker   . Smokeless tobacco: Not on file  . Alcohol Use: Not on file  . Drug Use: Not on file  . Sexual Activity: Not on file   Other Topics Concern  . 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    No ets.     No past surgical history on  file.  No family history on file.  Allergies  Allergen Reactions  . Amoxicillin     REACTION: rash    No current outpatient prescriptions on file prior to visit.   No current facility-administered medications on file prior to visit.    BP 112/70 mmHg  Temp(Src) 99.5 F (37.5 C) (Oral)  Ht 5\' 11"  (1.803 m)  Wt 199 lb 8 oz (90.493 kg)  BMI 27.84 kg/m2       Objective:   Physical Exam  Constitutional: He is oriented to person, place, and time. He appears well-developed and well-nourished. No distress.  HENT:  Head: Normocephalic and atraumatic.  Right Ear: Hearing, tympanic membrane, external ear and ear canal normal. No drainage. Tympanic membrane is not erythematous and not bulging.  Left Ear: Hearing, tympanic membrane, external ear and ear canal normal. No drainage. Tympanic membrane is not erythematous and not bulging.  Nose: Mucosal edema (Clear) and rhinorrhea present. Right sinus exhibits maxillary sinus tenderness. Right sinus exhibits no frontal sinus tenderness. Left sinus exhibits maxillary sinus tenderness. Left sinus exhibits no frontal sinus tenderness.  Mouth/Throat: Oropharynx is clear and moist. No oropharyngeal exudate.  Cardiovascular: Normal rate, normal heart sounds and intact distal pulses.   Pulmonary/Chest: Effort normal and breath sounds normal. No respiratory distress. He has no wheezes. He has no rales. He exhibits  no tenderness.  Musculoskeletal: Normal range of motion. He exhibits no edema or tenderness.  Neurological: He is alert and oriented to person, place, and time.  Skin: Skin is warm and dry. No rash noted. He is not diaphoretic. No erythema. No pallor.  Psychiatric: He has a normal mood and affect. His behavior is normal. Judgment and thought content normal.  Nursing note and vitals reviewed.      Assessment & Plan:  1. URI (upper respiratory infection) - Has prescribed azithromycin multiple times in the past for his chronic URIs.  Ascribe him doxycycline for 10 days if that does not work he has a paper prescription for Z-Pak - doxycycline (VIBRAMYCIN) 100 MG capsule; Take 1 capsule (100 mg total) by mouth 2 (two) times daily.  Dispense: 20 capsule; Refill: 0 - azithromycin (ZITHROMAX) 250 MG tablet; Two pills on day one,one pill on days 2-4  Dispense: 6 tablet; Refill: 0 - Also use Flonase - Follow up if no improvement.   Dorothyann Peng, NP

## 2015-06-07 ENCOUNTER — Telehealth: Payer: Self-pay | Admitting: Internal Medicine

## 2015-06-07 NOTE — Telephone Encounter (Signed)
Patient Name: Eric Haynes DOB: 12/31/1980 Initial Comment Caller states was seen Tuesday and given ABX for sinus infection. still not feeling well. wants to know if he should be seen again Nurse Assessment Nurse: Vallery Sa, RN, [place] Date/Time (Eastern Time): 06/07/2015 2:40:04 PM Confirm and document reason for call. If symptomatic, describe symptoms. You must click the next button to save text entered. ---Caller states he started antibiotics for a sinus infection 2 days ago for sinus infection and continues to have a low grade fever, cough and sinus pain. No severe breathing difficulty. Alert and responsive. Has the patient traveled out of the country within the last 30 days? ---Yes Where have you traveled? ([place] for [place] and Ebola guideline, [place], [place] for [place]) ---[place] Does the patient have any new or worsening symptoms? ---Yes Will a triage be completed? ---Yes Related visit to physician within the last 2 weeks? ---Yes Does the PT have any chronic conditions? (i.e. diabetes, asthma, etc.) ---No Is this a behavioral health or substance abuse call? ---No Guidelines Guideline Title Affirmed Question Affirmed Notes Sinus Infection on Antibiotic Follow-up Call [1] Taking antibiotic > 48 hours (2 days) AND [2] fever persists Final Disposition User See Physician within [place], RN, T[place] Comments Scheduled for 9:45 am appointment with Dr. Regis Bill on 06/08/14 Referrals REFERRED TO PCP OFFICE Disagree/Comply: Leta Baptist

## 2015-06-07 NOTE — Telephone Encounter (Signed)
Please see message and advise 

## 2015-06-07 NOTE — Telephone Encounter (Signed)
I would advise him to finish out the current course of antibiotics and if not feeling well then follow up with Dr. Regis Bill

## 2015-06-07 NOTE — Telephone Encounter (Signed)
Noted  

## 2015-06-08 ENCOUNTER — Encounter: Payer: Self-pay | Admitting: Internal Medicine

## 2015-06-08 ENCOUNTER — Ambulatory Visit (INDEPENDENT_AMBULATORY_CARE_PROVIDER_SITE_OTHER): Payer: Managed Care, Other (non HMO) | Admitting: Internal Medicine

## 2015-06-08 VITALS — BP 110/70 | HR 72 | Temp 98.9°F | Wt 199.0 lb

## 2015-06-08 DIAGNOSIS — R6889 Other general symptoms and signs: Secondary | ICD-10-CM | POA: Diagnosis not present

## 2015-06-08 DIAGNOSIS — J329 Chronic sinusitis, unspecified: Secondary | ICD-10-CM

## 2015-06-08 NOTE — Progress Notes (Signed)
Pre visit review using our clinic review tool, if applicable. No additional management support is needed unless otherwise documented below in the visit note. 

## 2015-06-08 NOTE — Progress Notes (Signed)
Chief Complaint  Patient presents with  . Sinusitis    HPI: Eric Haynes 35 y.o.  Patient Eric Haynes  comes in today for SDA for  Ongoing problem evaluation. ONSET BODY ACHES AND FEVEr  At onset and myalgias gone today .  No sob sleeping a lot .   saw cory    And on doxy cycle .    For 2 days  Not sinus pressure like in apst  But now cough is productive  .   No focal pain  worsening no rash  No sob . Out of work  Education officer, community )  ? About antibiotic  ROS: See pertinent positives and negatives per HPI.  Past Medical History  Diagnosis Date  . CHICKENPOX, HX OF 03/30/2007    Qualifier: Diagnosis of  By: Hulan Saas, CMA (AAMA), Quita Skye   . HERPES LABIALIS, HX OF 03/30/2007    Qualifier: Diagnosis of  By: Regis Bill MD, Standley Brooking     No family history on file.  Social History   Social History  . Marital Status: Single    Spouse Name: N/A  . Number of Children: N/A  . Years of Education: N/A   Social History Main Topics  . Smoking status: Never Smoker   . Smokeless tobacco: Not on file  . Alcohol Use: Not on file  . Drug Use: Not on file  . Sexual Activity: Not on file   Other Topics Concern  . 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    No ets.     Outpatient Prescriptions Prior to Visit  Medication Sig Dispense Refill  . doxycycline (VIBRAMYCIN) 100 MG capsule Take 1 capsule (100 mg total) by mouth 2 (two) times daily. 20 capsule 0  . azithromycin (ZITHROMAX) 250 MG tablet Two pills on day one,one pill on days 2-4 6 tablet 0   No facility-administered medications prior to visit.     EXAM:  BP 110/70 mmHg  Pulse 72  Temp(Src) 98.9 F (37.2 C) (Oral)  Wt 199 lb (90.266 kg)  SpO2 96%  Body mass index is 27.77 kg/(m^2).  GENERAL: vitals reviewed and listed above, alert, oriented, appears well hydrated and in no acute distress midl congestion non toxic  HEENT: atraumatic, conjunctiva  clear, no obvious abnormalities on  inspection of external nose and ears tms intact bnareas congestion no face pain  OP : no lesion edema or exudate  NECK: no obvious masses on inspection palpation  LUNGS: clear to auscultation bilaterally, no wheezes, rales or rhonchi, good air movement CV: HRRR, no clubbing cyanosis or  peripheral edema nl cap refill  MS: moves all extremities without noticeable focal  abnormality PSYCH: pleasant and cooperative, no obvious depression or anxiety  ASSESSMENT AND PLAN:  Discussed the following assessment and plan:  Flu-like symptoms - convalescing   Recurrent sinus infections - remian on docy [place] of pna and bact sinus    Expectant management.    Monitor for   Complications   Out of work until congestion better finish antibiotic  -Patient advised to return or notify health care team  if symptoms worsen ,persist or new concerns arise.  Patient Instructions  Acts like resolving flu     Stay on the doxy  As this can rx bacterial sinuitis.   Expect cough for another 7- 10 days .   But should be feeling a lot beter. Contact us  if getting fever again relapsing sx  shortenss of breath  .   Influenza, Adult Influenza ("the flu") is a viral infection of the respiratory tract. It occurs more often in winter months because people spend more time in close contact with one another. Influenza can make you feel very sick. Influenza easily spreads from person to person (contagious). CAUSES  Influenza is caused by a virus that infects the respiratory tract. You can catch the virus by breathing in droplets from an infected person's cough or sneeze. You can also catch the virus by touching something that was recently contaminated with the virus and then touching your mouth, nose, or eyes. RISKS AND COMPLICATIONS You may be at risk for a more severe case of influenza if you smoke cigarettes, have diabetes, have chronic heart disease (such as heart failure) or lung disease (such as asthma), or if you have a  weakened immune system. Elderly people and pregnant women are also at risk for more serious infections. The most common problem of influenza is a lung infection (pneumonia). Sometimes, this problem can require emergency medical care and may be life threatening. SIGNS AND SYMPTOMS  Symptoms typically last 4 to 10 days and may include:  Fever.  Chills.  Headache, body aches, and muscle aches.  Sore throat.  Chest discomfort and cough.  Poor appetite.  Weakness or feeling tired.  Dizziness.  Nausea or vomiting. DIAGNOSIS  Diagnosis of influenza is often made based on your history and a physical exam. A nose or throat swab test can be done to confirm the diagnosis. TREATMENT  In mild cases, influenza goes away on its own. Treatment is directed at relieving symptoms. For more severe cases, your health care provider may prescribe antiviral medicines to shorten the sickness. Antibiotic medicines are not effective because the infection is caused by a virus, not by bacteria. HOME CARE INSTRUCTIONS  Take medicines only as directed by your health care provider.  Use a cool mist humidifier to make breathing easier.  Get plenty of rest until your temperature returns to normal. This usually takes 3 to 4 days.  Drink enough fluid to keep your urine clear or pale yellow.  Cover yourmouth and nosewhen coughing or sneezing,and wash your handswellto prevent thevirusfrom spreading.  Stay homefromwork orschool untilthe fever is gonefor at least 74full day. PREVENTION  An annual influenza vaccination (flu shot) is the best way to avoid getting influenza. An annual flu shot is now routinely recommended for all adults in the [place] IF:  You experiencechest pain, yourcough worsens,or you producemore mucus.  Youhave nausea,vomiting, ordiarrhea.  Your fever returns or gets worse. SEEK IMMEDIATE MEDICAL CARE IF:  You havetrouble breathing, you become short of  breath,or your skin ornails becomebluish.  You have severe painor stiffnessin the neck.  You develop a sudden headache, or pain in the face or ear.  You have nausea or vomiting that you cannot control. MAKE SURE YOU:   Understand these instructions.  Will watch your condition.  Will get help right away if you are not doing well or get worse.   This information is not intended to replace advice given to you by your health care provider. Make sure you discuss any questions you have with your health care provider.   Document Released: 02/15/2000 Document Revised: 03/10/2014 Document Reviewed: 05/19/2011 Elsevier Interactive Patient Education 2016 [place] K. Panosh M.D.

## 2015-06-08 NOTE — Patient Instructions (Signed)
Acts like resolving flu     Stay on the doxy  As this can rx bacterial sinuitis.   Expect cough for another 7- 10 days .   But should be feeling a lot beter. Contact us if getting fever again relapsing sx  shortenss of breath  .   Influenza, Adult Influenza ("the flu") is a viral infection of the respiratory tract. It occurs more often in winter months because people spend more time in close contact with one another. Influenza can make you feel very sick. Influenza easily spreads from person to person (contagious). CAUSES  Influenza is caused by a virus that infects the respiratory tract. You can catch the virus by breathing in droplets from an infected person's cough or sneeze. You can also catch the virus by touching something that was recently contaminated with the virus and then touching your mouth, nose, or eyes. RISKS AND COMPLICATIONS You may be at risk for a more severe case of influenza if you smoke cigarettes, have diabetes, have chronic heart disease (such as heart failure) or lung disease (such as asthma), or if you have a weakened immune system. Elderly people and pregnant women are also at risk for more serious infections. The most common problem of influenza is a lung infection (pneumonia). Sometimes, this problem can require emergency medical care and may be life threatening. SIGNS AND SYMPTOMS  Symptoms typically last 4 to 10 days and may include:  Fever.  Chills.  Headache, body aches, and muscle aches.  Sore throat.  Chest discomfort and cough.  Poor appetite.  Weakness or feeling tired.  Dizziness.  Nausea or vomiting. DIAGNOSIS  Diagnosis of influenza is often made based on your history and a physical exam. A nose or throat swab test can be done to confirm the diagnosis. TREATMENT  In mild cases, influenza goes away on its own. Treatment is directed at relieving symptoms. For more severe cases, your health care provider may prescribe antiviral medicines to  shorten the sickness. Antibiotic medicines are not effective because the infection is caused by a virus, not by bacteria. HOME CARE INSTRUCTIONS  Take medicines only as directed by your health care provider.  Use a cool mist humidifier to make breathing easier.  Get plenty of rest until your temperature returns to normal. This usually takes 3 to 4 days.  Drink enough fluid to keep your urine clear or pale yellow.  Cover yourmouth and nosewhen coughing or sneezing,and wash your handswellto prevent thevirusfrom spreading.  Stay homefromwork orschool untilthe fever is gonefor at least 60full day. PREVENTION  An annual influenza vaccination (flu shot) is the best way to avoid getting influenza. An annual flu shot is now routinely recommended for all adults in the [place] IF:  You experiencechest pain, yourcough worsens,or you producemore mucus.  Youhave nausea,vomiting, ordiarrhea.  Your fever returns or gets worse. SEEK IMMEDIATE MEDICAL CARE IF:  You havetrouble breathing, you become short of breath,or your skin ornails becomebluish.  You have severe painor stiffnessin the neck.  You develop a sudden headache, or pain in the face or ear.  You have nausea or vomiting that you cannot control. MAKE SURE YOU:   Understand these instructions.  Will watch your condition.  Will get help right away if you are not doing well or get worse.   This information is not intended to replace advice given to you by your health care provider. Make sure you discuss any questions you have with your health care  provider.   Document Released: 02/15/2000 Document Revised: 03/10/2014 Document Reviewed: 05/19/2011 Elsevier Interactive Patient Education [employer].

## 2015-12-31 ENCOUNTER — Ambulatory Visit: Payer: Managed Care, Other (non HMO) | Admitting: *Deleted

## 2016-01-03 ENCOUNTER — Ambulatory Visit (INDEPENDENT_AMBULATORY_CARE_PROVIDER_SITE_OTHER): Payer: Managed Care, Other (non HMO) | Admitting: *Deleted

## 2016-01-03 DIAGNOSIS — Z23 Encounter for immunization: Secondary | ICD-10-CM | POA: Diagnosis not present

## 2016-01-28 ENCOUNTER — Encounter: Payer: Self-pay | Admitting: Internal Medicine

## 2016-01-28 ENCOUNTER — Ambulatory Visit (INDEPENDENT_AMBULATORY_CARE_PROVIDER_SITE_OTHER): Payer: Managed Care, Other (non HMO) | Admitting: Internal Medicine

## 2016-01-28 ENCOUNTER — Ambulatory Visit (INDEPENDENT_AMBULATORY_CARE_PROVIDER_SITE_OTHER)
Admission: RE | Admit: 2016-01-28 | Discharge: 2016-01-28 | Disposition: A | Discharge status: Home / Self Care | Payer: Managed Care, Other (non HMO) | Source: Ambulatory Visit | Attending: Internal Medicine | Admitting: Internal Medicine

## 2016-01-28 VITALS — BP 124/70 | Temp 98.2°F | Wt 193.6 lb

## 2016-01-28 DIAGNOSIS — R042 Hemoptysis: Secondary | ICD-10-CM

## 2016-01-28 DIAGNOSIS — Z23 Encounter for immunization: Secondary | ICD-10-CM

## 2016-01-28 DIAGNOSIS — R0989 Other specified symptoms and signs involving the circulatory and respiratory systems: Secondary | ICD-10-CM | POA: Diagnosis not present

## 2016-01-28 MED ORDER — DOXYCYCLINE HYCLATE 100 MG PO CAPS: 100.0000 mg | ORAL_CAPSULE | Freq: Two times a day (BID) | ORAL | 0 refills | Status: DC

## 2016-01-28 NOTE — Progress Notes (Signed)
Pre visit review using our clinic review tool, if applicable. No additional management support is needed unless otherwise documented below in the visit note.  Chief Complaint  Patient presents with  . Hemoptysis    Started on the 11th or 12th.    HPI: Eric Haynes 35 y.o.  comes in today for acute visit. He has some mild upper respiratory congestion November 11 or 12th and improved with Sudafed and then a minor sore throat that got better with gargles. But he began to have large chunks of dark blood mucousy that he would cough out in the morning. At the end of the cough blood would be brighter. However he did not cough this out all day nor does he have a deep respiratory cough fever or severe pain like he gets with sinusitis. 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     Some mouth breathing not a lot of snoring. Not as green as sinusiitis.    ROS: See pertinent positives and negatives per HPI. No chest pain shortness of breath interfering with sliding with this time. May need another FMLA form filled out when the last one expired.   Past Medical History:  Diagnosis Date  . CHICKENPOX, HX OF 03/30/2007   Qualifier: Diagnosis of  By: Hulan Saas, CMA (AAMA), Quita Skye   . HERPES LABIALIS, HX OF 03/30/2007   Qualifier: Diagnosis of  By: Regis Bill MD, Standley Brooking     No family history on file.  Social History   Social History  . Marital status: Single    Spouse name: N/A  . Number of children: N/A  . Years of education: N/A   Social History Main Topics  . Smoking status: Never Smoker  . Smokeless tobacco: Never Used  . Alcohol use Yes  . Drug use: No  . Sexual activity: Not Asked   Other Topics Concern  . 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    No ets.     Outpatient Medications Prior to Visit  Medication Sig Dispense Refill  . doxycycline (VIBRAMYCIN) 100 MG capsule Take 1 capsule (100 mg total) by mouth 2 (two) times daily. 20  capsule 0   No facility-administered medications prior to visit.      EXAM:  BP 124/70 (BP Location: Right Arm, Patient Position: Sitting, Cuff Size: Normal)   Temp 98.2 F (36.8 C) (Oral)   Wt 193 lb 9.6 oz (87.8 kg)   BMI 27.00 kg/m   Body mass index is 27 kg/m.  GENERAL: vitals reviewed and listed above, alert, oriented, appears well hydrated and in no acute distressMild upper respiratory congestion but not coughing or sneezing. HEENT: atraumatic, conjunctiva  clear, no obvious abnormalities on inspection of external nose and ears TMs are clear nares minimally congested face nontender. OP : no lesion edema or exudate there is some cobblestoning and it is red but no point redness or obvious bleeding point. NECK: no obvious masses on inspection palpation  LUNGS: clear to auscultation bilaterally, no wheezes, rales or rhonchi, good air movement CV: HRRR, no clubbing cyanosis or  peripheral edema nl cap refill  MS: moves all extremities without noticeable focal  abnormality PSYCH: pleasant and cooperative, no obvious depression or anxiety  ASSESSMENT AND PLAN:  Discussed the following assessment and plan:  Hemoptysis - Appears to be upper respiratory causation possibly aggravated by dryness. 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 Hasn't had to  takeoff quite a while. -Patient advised to return or notify health care team  if symptoms worsen ,persist or new concerns arise.  Patient Instructions  Get x ray  As we discussed .   Empiric treatment for bacterial  Infection   Humidify air you breath when you sleep .  Dri ness may be contributing.    If ongoing blood we will get ent to see you.     Standley Brooking. Clover Feehan  M.D.

## 2016-01-28 NOTE — Patient Instructions (Signed)
Get x ray  As we discussed .   Empiric treatment for bacterial  Infection   Humidify air you breath when you sleep .  Dri ness may be contributing.    If ongoing blood we will get ent to see you.

## 2016-01-29 ENCOUNTER — Telehealth: Payer: Self-pay | Admitting: Internal Medicine

## 2016-01-29 NOTE — Telephone Encounter (Signed)
Pt notified of results by telephone.  See chest x-ray result from 01/28/16.

## 2016-01-29 NOTE — Telephone Encounter (Signed)
Pt returning your call concerning xray. Pt will be around all afternoon

## 2016-06-09 DIAGNOSIS — M545 Low back pain: Secondary | ICD-10-CM | POA: Diagnosis not present

## 2016-08-11 NOTE — Progress Notes (Signed)
   Chief Complaint  Patient presents with  . Cough    sx started tuesday/ tried tylenol   . Nasal Congestion  . Headache    HPI: Eric Haynes 36 y.o.  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 Has had some feeling of sinus pressure and ears clogged over the weekend and took an over-the-counter decongestant with various help. He is having some discolored nasal drainage. He has to fly tomorrow. Some cough no fever no hemoptysis.   ROS: See pertinent positives and negatives per HPI.  Past Medical History:  Diagnosis Date  . CHICKENPOX, HX OF 03/30/2007   Qualifier: Diagnosis of  By: Hulan Saas, CMA (AAMA), Quita Skye   . HERPES LABIALIS, HX OF 03/30/2007   Qualifier: Diagnosis of  By: Regis Bill MD, Standley Brooking     No family history on file.  Social History   Social History  . Marital status: Single    Spouse name: N/A  . Number of children: N/A  . Years of education: N/A   Social History Main Topics  . Smoking status: Never Smoker  . Smokeless tobacco: Never Used  . Alcohol use Yes  . Drug use: No  . Sexual activity: Not Asked   Other Topics Concern  . 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    No ets.     Outpatient Medications Prior to Visit  Medication Sig Dispense Refill  . doxycycline (VIBRAMYCIN) 100 MG capsule Take 1 capsule (100 mg total) by mouth 2 (two) times daily. 14 capsule 0   No facility-administered medications prior to visit.      EXAM:  BP 100/62 (BP Location: Right Arm, Patient Position: Sitting, Cuff Size: Normal)   Pulse 76   Temp 98.2 F (36.8 C) (Oral)   Ht 5\' 11"  (1.803 m)   Wt 196 lb 8 oz (89.1 kg)   SpO2 97%   BMI 27.41 kg/m   Body mass index is 27.41 kg/m. WDWN in NAD  quiet respirations; mod  congested   Non toxic . HEENT: Normocephalic ;atraumatic , Eyes;  PERRL, EOMs  Full, lids and conjunctiva  clear,,Ears: no deformities, canals nl, TM landmarks normal, Nose: no deformity or discharge but congested;face minimally tender Mouth : OP clear without lesion or edema . Neck: Supple without adenopathy or masses or bruits Chest:  Clear to A&P without wheezes rales or rhonchi CV:  S1-S2 no gallops or murmurs peripheral perfusion is normal Skin :nl perfusion and no acute rashes    ASSESSMENT AND PLAN:  Discussed the following assessment and plan:  Acute rhinosinusitis  Dysfunction of Eustachian tube, unspecified laterality Patient states she's going to need updated FMLA form he has been seen by urgent care a couple times this past year and had to miss flying or delay. Empiric rx  As planned disc  -Patient advised to return or notify health care team  if symptoms worsen ,persist or new concerns arise.  Patient Instructions  Can try antibiotic  .  For possible sinus infection  Your ears look normal but you have Eustachian tube dysfunction .   And prednisone  3-5 days.  Also as we discussed  Saline nose spray decongestant before flying.          Standley Brooking. Panosh M.D.

## 2016-08-12 ENCOUNTER — Encounter: Payer: Self-pay | Admitting: Internal Medicine

## 2016-08-12 ENCOUNTER — Ambulatory Visit (INDEPENDENT_AMBULATORY_CARE_PROVIDER_SITE_OTHER): Payer: 59 | Admitting: Internal Medicine

## 2016-08-12 VITALS — BP 100/62 | HR 76 | Temp 98.2°F | Ht 71.0 in | Wt 196.5 lb

## 2016-08-12 DIAGNOSIS — J019 Acute sinusitis, unspecified: Secondary | ICD-10-CM

## 2016-08-12 DIAGNOSIS — H6980 Other specified disorders of Eustachian tube, unspecified ear: Secondary | ICD-10-CM | POA: Diagnosis not present

## 2016-08-12 MED ORDER — DOXYCYCLINE HYCLATE 100 MG PO CAPS: 100.0000 mg | ORAL_CAPSULE | Freq: Two times a day (BID) | ORAL | 0 refills | Status: DC

## 2016-08-12 MED ORDER — PREDNISONE 20 MG PO TABS: 20.0000 mg | ORAL_TABLET | Freq: Two times a day (BID) | ORAL | 0 refills | Status: DC

## 2016-08-12 NOTE — Patient Instructions (Signed)
Can try antibiotic  .  For possible sinus infection  Your ears look normal but you have Eustachian tube dysfunction .   And prednisone  3-5 days.  Also as we discussed  Saline nose spray decongestant before flying.

## 2016-08-22 ENCOUNTER — Encounter: Payer: Self-pay | Admitting: Family Medicine

## 2016-08-22 DIAGNOSIS — Z0289 Encounter for other administrative examinations: Secondary | ICD-10-CM

## 2016-09-29 ENCOUNTER — Encounter (HOSPITAL_COMMUNITY): Payer: Self-pay

## 2016-09-29 ENCOUNTER — Emergency Department (HOSPITAL_COMMUNITY): Payer: 59

## 2016-09-29 ENCOUNTER — Emergency Department (HOSPITAL_COMMUNITY)
Admission: EM | Admit: 2016-09-29 | Discharge: 2016-09-29 | Disposition: A | Discharge status: Home / Self Care | Payer: 59 | Attending: Emergency Medicine | Admitting: Emergency Medicine

## 2016-09-29 DIAGNOSIS — Y9389 Activity, other specified: Secondary | ICD-10-CM | POA: Insufficient documentation

## 2016-09-29 DIAGNOSIS — W268XXA Contact with other sharp object(s), not elsewhere classified, initial encounter: Secondary | ICD-10-CM | POA: Diagnosis not present

## 2016-09-29 DIAGNOSIS — Y999 Unspecified external cause status: Secondary | ICD-10-CM | POA: Diagnosis not present

## 2016-09-29 DIAGNOSIS — S61512A Laceration without foreign body of left wrist, initial encounter: Secondary | ICD-10-CM | POA: Diagnosis not present

## 2016-09-29 DIAGNOSIS — Y92009 Unspecified place in unspecified non-institutional (private) residence as the place of occurrence of the external cause: Secondary | ICD-10-CM | POA: Diagnosis not present

## 2016-09-29 MED ORDER — CEPHALEXIN 500 MG PO CAPS: 500.0000 mg | ORAL_CAPSULE | Freq: Four times a day (QID) | ORAL | 0 refills | Status: DC

## 2016-09-29 MED ORDER — LIDOCAINE HCL (PF) 1 % IJ SOLN
5.0000 mL | Freq: Once | INTRAMUSCULAR | Status: AC
  Administered 2016-09-29: 5 mL
  Filled 2016-09-29: qty 5

## 2016-09-29 NOTE — Discharge Instructions (Signed)
Follow up for wound check and suture removal in 7 to 10 days. Return sooner for any problems.

## 2016-09-29 NOTE — ED Notes (Signed)
PT states understanding of care given, follow up care, and medication prescribed. PT ambulated from ED to car with a steady gait. 

## 2016-09-29 NOTE — ED Triage Notes (Addendum)
Pt states a piece of metal cut his left wrist. Bleeding controlled. Approximately 2inch laceration. Covered with gauze. Full sensation and ROM noted

## 2016-09-29 NOTE — ED Provider Notes (Signed)
[facility] DEPT Provider Note   CSN: [id number] Arrival date & time: 09/29/16  1750   By signing my name below, I, Eunice Blase, attest that this documentation has been prepared under the direction and in the presence of [facility], [place]. Electronically signed, Eunice Blase, ED Scribe. 09/29/16. 6:52 PM.   History   Chief Complaint Chief Complaint  Patient presents with  . Wrist Injury   The history is provided by the patient and medical records. No language interpreter was used.    Eric Haynes is a 36 y.o. male presenting to the Emergency Department concerning a L wrist laceration the pt sustained PTA. He states a piece of metal got caught in a drill and cut his wrist at home. Some tingle in the L hand noted that subsided without intervention PTA. No PTA medications. Bleeding controlled on evaluation with gauze. Pt able to move all fingers without difficulty. No other complaints at this time. Tetanus UTD.  Past Medical History:  Diagnosis Date  . CHICKENPOX, HX OF 03/30/2007   Qualifier: Diagnosis of  By: Hulan Saas, CMA (AAMA), Quita Skye   . HERPES LABIALIS, HX OF 03/30/2007   Qualifier: Diagnosis of  By: Regis Bill MD, Standley Brooking     Patient Active Problem List   Diagnosis Date Noted  . Recurrent acute sinusitis 07/01/2012  . Neck pain 07/01/2012  . Change in skin mole per report  07/01/2012  . Barotrauma mechanism 09/26/2011  . NECK AND BACK PAIN 05/12/2008  . RASH AND OTHER NONSPECIFIC SKIN ERUPTION 05/12/2008  . URI 11/09/2007  . EUSTACHIAN TUBE DYSFUNCTION 06/15/2007  . HERPES LABIALIS, HX OF 03/30/2007  . CHICKENPOX, HX OF 03/30/2007    History reviewed. No pertinent surgical history.     Home Medications    Prior to Admission medications   Medication Sig Start Date End Date Taking? Authorizing Provider  cephALEXin (KEFLEX) 500 MG capsule Take 1 capsule (500 mg total) by mouth 4 (four) times daily. 09/29/16   Ashley Murrain, NP  doxycycline (VIBRAMYCIN) 100 MG  capsule Take 1 capsule (100 mg total) by mouth 2 (two) times daily. 08/12/16   Panosh, Standley Brooking, MD  predniSONE (DELTASONE) 20 MG tablet Take 1 tablet (20 mg total) by mouth 2 (two) times daily with a meal. 08/12/16   Panosh, Standley Brooking, MD    Family History History reviewed. No pertinent family history.  Social History Social History  Substance Use Topics  . Smoking status: Never Smoker  . Smokeless tobacco: Never Used  . Alcohol use Yes     Allergies   Amoxicillin   Review of Systems Review of Systems  Constitutional: Negative for chills, diaphoresis and fever.  Respiratory: Negative for shortness of breath.   Gastrointestinal: Negative for nausea and vomiting.  Musculoskeletal: Negative for arthralgias and myalgias.  Skin: Positive for wound. Negative for color change.  Allergic/Immunologic: Negative for immunocompromised state.  Neurological: Negative for dizziness, syncope, weakness, light-headedness, numbness and headaches.  Hematological: Does not bruise/bleed easily.  Psychiatric/Behavioral: The patient is not nervous/anxious.      Physical Exam Updated Vital Signs BP (!) 131/91 (BP Location: Right Arm)   Pulse 82   Temp 98.3 F (36.8 C) (Oral)   Resp 16   SpO2 98%   Physical Exam  Constitutional: He appears well-developed and well-nourished.  HENT:  Head: Normocephalic.  Eyes: EOM are normal.  Neck: Neck supple.  Cardiovascular: Normal rate.   Pulmonary/Chest: Effort normal.  Abdominal: Soft. There is no tenderness.  Musculoskeletal: Normal range of motion.       Left wrist: He exhibits tenderness and laceration. He exhibits normal range of motion.       Arms:      Left hand: He exhibits normal range of motion and normal capillary refill. Normal strength noted. He exhibits no thumb/finger opposition.  Neurological: He is alert. No sensory deficit.  Skin: Skin is warm and dry. Capillary refill takes less than 2 seconds. Laceration noted.  3 cm laceration to  the medial palmar aspect of the L wrist.  Psychiatric: He has a normal mood and affect. His behavior is normal.  Nursing note and vitals reviewed.    ED Treatments / Results  DIAGNOSTIC STUDIES: Oxygen Saturation is 98% on RA, NL by my interpretation.    COORDINATION OF CARE: 6:41 PM-Discussed next steps with pt. Pt verbalized understanding and is agreeable with the plan. Will order imaging and prepare pt for laceration repair.   Labs (all labs ordered are listed, but only abnormal results are displayed) Labs Reviewed - No data to display  EKG  EKG Interpretation None       Radiology Dg Wrist Complete Left  Result Date: 09/29/2016 CLINICAL DATA:  Laceration by sheet metal to left wrist. Initial encounter. EXAM: LEFT WRIST - COMPLETE 3+ VIEW COMPARISON:  None. FINDINGS: Soft tissue lucency visible likely corresponding to the clinical laceration along the ulnar palmar aspect of the wrist near the base of the thumb. No evidence of soft tissue foreign body. No fracture identified. The depth of laceration does potentially extend close to bone and the margin of the radiocarpal joint itself. IMPRESSION: Soft tissue lucency corresponding to soft tissue laceration without evidence of soft tissue foreign body or bony fracture. The depth of soft tissue laceration does appear to extend close to the margin of the joint/bone. Electronically Signed   By: Aletta Edouard M.D.   On: 09/29/2016 19:33    Procedures .Marland KitchenLaceration Repair Date/Time: 09/29/2016 8:01 PM Performed by: Ashley Murrain Authorized by: Ashley Murrain   Consent:    Consent obtained:  Verbal   Consent given by:  Patient   Risks discussed:  Infection, pain and poor cosmetic result   Alternatives discussed:  Referral Anesthesia (see MAR for exact dosages):    Anesthesia method:  Local infiltration   Local anesthetic:  Lidocaine 1% w/o epi Laceration details:    Location:  Hand   Hand location:  L wrist   Length (cm):   3 Repair type:    Repair type:  Simple Pre-procedure details:    Preparation:  Patient was prepped and draped in usual sterile fashion and imaging obtained to evaluate for foreign bodies Exploration:    Hemostasis achieved with:  Direct pressure   Wound exploration: wound explored through full range of motion and entire depth of wound probed and visualized     Wound extent: no foreign bodies/material noted, no nerve damage noted, no tendon damage noted and no underlying fracture noted     Contaminated: no   Treatment:    Area cleansed with:  Betadine and saline   Amount of cleaning:  Standard   Irrigation solution:  Sterile saline   Irrigation method:  Syringe Skin repair:    Repair method:  Sutures   Suture size:  5-0   Suture material:  Prolene   Suture technique:  Simple interrupted   Number of sutures:  5 Approximation:    Approximation:  Close Post-procedure details:  Dressing:  Sterile dressing (iodoform gauze and pressure dressing)   Patient tolerance of procedure:  Tolerated well, no immediate complications     (including critical care time)  Medications Ordered in ED Medications  lidocaine (PF) (XYLOCAINE) 1 % injection 5 mL (not administered)     Initial Impression / Assessment and Plan / ED Course  I have reviewed the triage vital signs and the nursing notes.  Tetanus UTD. Laceration occurred < 12 hours prior to repair. Discussed laceration care with pt and answered questions. Pt to f-u for suture removal in 7 to 10 days and wound check sooner should there be signs of dehiscence or infection. Pt is hemodynamically stable with no complaints prior to dc.     Final Clinical Impressions(s) / ED Diagnoses   Final diagnoses:  Laceration of skin of left wrist, initial encounter    New Prescriptions Current Discharge Medication List    START taking these medications   Details  cephALEXin (KEFLEX) 500 MG capsule Take 1 capsule (500 mg total) by mouth 4 (four)  times daily. Qty: 20 capsule, Refills: 0      I personally performed the services described in this documentation, which was scribed in my presence. The recorded information has been reviewed and is accurate.    Debroah Baller [place], [place] 09/29/16 2046    Deno Etienne, DO 09/29/16 2047

## 2016-10-07 DIAGNOSIS — S61512A Laceration without foreign body of left wrist, initial encounter: Secondary | ICD-10-CM | POA: Diagnosis not present

## 2016-11-17 ENCOUNTER — Ambulatory Visit (INDEPENDENT_AMBULATORY_CARE_PROVIDER_SITE_OTHER): Payer: 59 | Admitting: Emergency Medicine

## 2016-11-17 DIAGNOSIS — Z23 Encounter for immunization: Secondary | ICD-10-CM | POA: Diagnosis not present

## 2016-11-21 ENCOUNTER — Encounter: Payer: Self-pay | Admitting: Internal Medicine

## 2016-12-11 DIAGNOSIS — J329 Chronic sinusitis, unspecified: Secondary | ICD-10-CM | POA: Diagnosis not present

## 2016-12-11 DIAGNOSIS — H6980 Other specified disorders of Eustachian tube, unspecified ear: Secondary | ICD-10-CM | POA: Diagnosis not present

## 2017-02-11 ENCOUNTER — Ambulatory Visit: Payer: 59 | Admitting: Internal Medicine

## 2017-02-11 ENCOUNTER — Encounter: Payer: Self-pay | Admitting: Internal Medicine

## 2017-02-11 VITALS — BP 110/72 | HR 88 | Temp 99.4°F | Wt 198.4 lb

## 2017-02-11 DIAGNOSIS — J069 Acute upper respiratory infection, unspecified: Secondary | ICD-10-CM | POA: Diagnosis not present

## 2017-02-11 DIAGNOSIS — R509 Fever, unspecified: Secondary | ICD-10-CM

## 2017-02-11 LAB — POCT INFLUENZA A/B
INFLUENZA A, POC: NEGATIVE
Influenza B, POC: NEGATIVE

## 2017-02-11 NOTE — Patient Instructions (Addendum)
Drink as much fluid as you  can tolerate over the next few days  Take [phone] mg of Tylenol every 6 hours as needed for pain relief or fever.  Avoid taking more than 3000 mg in a 24-hour period (  This may cause liver damage).  Follow these instructions at home: Medicines  Take over-the-counter and prescription medicines only as told by your health care provider.  . General instructions  Get plenty of rest.  Drink enough fluids to keep your urine clear or pale yellow.    Use an inhaler, cool mist vaporizer, or humidifier as told by your health care provider.   How is this prevented?  To lower your risk of getting this condition again:  Wash your hands often with soap and water. If soap and water are not available, use hand sanitizer.  Avoid contact with people who have cold symptoms.  Try not to touch your hands to your mouth, nose, or eyes.  Make sure to get the flu shot every year.

## 2017-02-11 NOTE — Progress Notes (Signed)
Subjective:    Patient ID: Eric Haynes, male    DOB: 07/25/1980, 36 y.o.   MRN: [id number]  HPI  36 year old patient who awoke well this a.m.  This afternoon developed fever as high as 101.6.  She does have a young daughter who has recently treated for a community-acquired pneumonia. He generally feels well except for some fatigue.  Denies much in the way of headache or myalgias.  He did take Tylenol earlier with improvement in his temperature.  Past Medical History:  Diagnosis Date  . CHICKENPOX, HX OF 03/30/2007   Qualifier: Diagnosis of  By: Hulan Saas, CMA (AAMA), Quita Skye   . HERPES LABIALIS, HX OF 03/30/2007   Qualifier: Diagnosis of  By: Regis Bill MD, Standley Brooking      Social History   Socioeconomic History  . Marital status: Single    Spouse name: Not on file  . Number of children: Not on file  . Years of education: Not on file  . Highest education level: Not on file  Social Needs  . Financial resource strain: Not on file  . Food insecurity - worry: Not on file  . Food insecurity - inability: Not on file  . Transportation needs - medical: Not on file  . Transportation needs - non-medical: Not on file  Occupational History  . Not on file  Tobacco Use  . Smoking status: Never Smoker  . Smokeless tobacco: Never Used  Substance and Sexual Activity  . Alcohol use: Yes  . Drug use: No  . Sexual activity: Not on file  Other Topics Concern  . 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    No ets.     History reviewed. No pertinent surgical history.  History reviewed. No pertinent family history.  Allergies  Allergen Reactions  . Amoxicillin     REACTION: rash    Current Outpatient Medications on File Prior to Visit  Medication Sig Dispense Refill  . cephALEXin (KEFLEX) 500 MG capsule Take 1 capsule (500 mg total) by mouth 4 (four) times daily. 20 capsule 0  . doxycycline (VIBRAMYCIN) 100 MG capsule Take 1 capsule (100 mg  total) by mouth 2 (two) times daily. 14 capsule 0  . predniSONE (DELTASONE) 20 MG tablet Take 1 tablet (20 mg total) by mouth 2 (two) times daily with a meal. 10 tablet 0   No current facility-administered medications on file prior to visit.     BP 110/72 (BP Location: Right Arm, Patient Position: Sitting, Cuff Size: Normal)   Pulse 88   Temp 99.4 F (37.4 C) (Oral)   Wt 198 lb 6.4 oz (90 kg)   SpO2 97%   BMI 27.67 kg/m     Review of Systems  Constitutional: Positive for activity change, fatigue and fever. Negative for appetite change and chills.  HENT: Negative for congestion, dental problem, ear pain, hearing loss, sore throat, tinnitus, trouble swallowing and voice change.   Eyes: Negative for pain, discharge and visual disturbance.  Respiratory: Negative for cough, chest tightness, wheezing and stridor.   Cardiovascular: Negative for chest pain, palpitations and leg swelling.  Gastrointestinal: Negative for abdominal distention, abdominal pain, blood in stool, constipation, diarrhea, nausea and vomiting.  Genitourinary: Negative for difficulty urinating, discharge, flank pain, genital sores, hematuria and urgency.  Musculoskeletal: Negative for arthralgias, back pain, gait problem, joint swelling, myalgias and neck stiffness.  Skin: Negative for rash.  Neurological: Negative for  dizziness, syncope, speech difficulty, weakness, numbness and headaches.  Hematological: Negative for adenopathy. Does not bruise/bleed easily.  Psychiatric/Behavioral: Negative for behavioral problems and dysphoric mood. The patient is not nervous/anxious.        Objective:   Physical Exam  Constitutional: He is oriented to person, place, and time. He appears well-developed.  Temperature 99.4 Appears well and in no distress Pulse 88  HENT:  Head: Normocephalic.  Right Ear: External ear normal.  Left Ear: External ear normal.  Mouth/Throat: Oropharynx is clear and moist.  Eyes: Conjunctivae and  EOM are normal.  Neck: Normal range of motion.  Cardiovascular: Normal rate and normal heart sounds.  Pulmonary/Chest: Breath sounds normal. No respiratory distress. He has no wheezes. He has no rales.  Abdominal: Bowel sounds are normal.  Musculoskeletal: Normal range of motion. He exhibits no edema or tenderness.  Neurological: He is alert and oriented to person, place, and time.  Psychiatric: He has a normal mood and affect. His behavior is normal.          Assessment & Plan:   Acute febrile illness.  Viral.  Will screen for influenza Treat symptomatically Force fluids  Nyoka Cowden

## 2017-03-15 DIAGNOSIS — J019 Acute sinusitis, unspecified: Secondary | ICD-10-CM | POA: Diagnosis not present

## 2017-03-22 DIAGNOSIS — H6983 Other specified disorders of Eustachian tube, bilateral: Secondary | ICD-10-CM | POA: Diagnosis not present

## 2017-03-30 DIAGNOSIS — Z0000 Encounter for general adult medical examination without abnormal findings: Secondary | ICD-10-CM | POA: Diagnosis not present

## 2017-06-05 ENCOUNTER — Encounter: Payer: Self-pay | Admitting: Internal Medicine

## 2017-06-05 ENCOUNTER — Ambulatory Visit: Payer: 59 | Admitting: Internal Medicine

## 2017-06-05 VITALS — BP 108/68 | HR 62 | Temp 98.4°F | Wt 197.4 lb

## 2017-06-05 DIAGNOSIS — J0191 Acute recurrent sinusitis, unspecified: Secondary | ICD-10-CM

## 2017-06-05 DIAGNOSIS — J329 Chronic sinusitis, unspecified: Secondary | ICD-10-CM

## 2017-06-05 DIAGNOSIS — H6980 Other specified disorders of Eustachian tube, unspecified ear: Secondary | ICD-10-CM | POA: Diagnosis not present

## 2017-06-05 MED ORDER — AMOXICILLIN-POT CLAVULANATE 875-125 MG PO TABS: 1.0000 | ORAL_TABLET | Freq: Two times a day (BID) | ORAL | 0 refills | Status: DC

## 2017-06-05 MED ORDER — PREDNISONE 20 MG PO TABS: 20.0000 mg | ORAL_TABLET | Freq: Two times a day (BID) | ORAL | 0 refills | Status: DC

## 2017-06-05 MED ORDER — FLUTICASONE PROPIONATE 50 MCG/ACT NA SUSP: 2.0000 | Freq: Every day | NASAL | 99 refills | Status: AC

## 2017-06-05 NOTE — Progress Notes (Signed)
Chief Complaint  Patient presents with  . head cold    started x 2 weeks ago and seemed to clear up with Sudafed. Pt states that he was feeling fine and then on 4/3 his symptoms started back up. Pt C/o congestion, sneezing, sinus pain (worse with traveling -flying), HA, very little runny nose/PND. Little cough "off and on", dry. Denies fever, chills or body aches. Currently taking Sudafed PRN    HPI: Eric Haynes 37 y.o.  sda    Went back to work for 4 days.     After last  Episode   Has fmla form  For [place]   Was rx in [place] urgent care   Steroid rocephoin and then Augmentin and prednisone  Finally helps( 2 visits )  Also had   CPX at that time .   Then new sx  Since  last month  . Was using flonase at beginning and not now.  Has nasal face  sinus and ear pressure.   ? Dec hearing left ear .  Marland Kitchen No fever now.    ROS: See pertinent positives and negatives per HPI.  Past Medical History:  Diagnosis Date  . CHICKENPOX, HX OF 03/30/2007   Qualifier: Diagnosis of  By: Hulan Saas, CMA (AAMA), Quita Skye   . HERPES LABIALIS, HX OF 03/30/2007   Qualifier: Diagnosis of  By: Regis Bill MD, Standley Brooking     No family history on file.  Social History   Socioeconomic History  . Marital status: Single    Spouse name: Not on file  . Number of children: Not on file  . Years of education: Not on file  . Highest education level: Not on file  Occupational History  . Not on file  Social Needs  . Financial resource strain: Not on file  . Food insecurity:    Worry: Not on file    Inability: Not on file  . Transportation needs:    Medical: Not on file    Non-medical: Not on file  Tobacco Use  . Smoking status: Never Smoker  . Smokeless tobacco: Never Used  Substance and Sexual Activity  . Alcohol use: Yes  . Drug use: No  . Sexual activity: Not on file  Lifestyle  . Physical activity:    Days per week: Not on file    Minutes per session: Not on file  . Stress: Not on file  Relationships  .  Social connections:    Talks on phone: Not on file    Gets together: Not on file    Attends religious service: Not on file    Active member of club or organization: Not on file    Attends meetings of clubs or organizations: Not on file    Relationship status: Not on file  Other Topics Concern  . 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    No ets.     Outpatient Medications Prior to Visit  Medication Sig Dispense Refill  . cephALEXin (KEFLEX) 500 MG capsule Take 1 capsule (500 mg total) by mouth 4 (four) times daily. (Patient not taking: Reported on 06/05/2017) 20 capsule 0  . doxycycline (VIBRAMYCIN) 100 MG capsule Take 1 capsule (100 mg total) by mouth 2 (two) times daily. (Patient not taking: Reported on 06/05/2017) 14 capsule 0  . predniSONE (DELTASONE) 20 MG tablet Take 1 tablet (20 mg total) by mouth 2 (  two) times daily with a meal. (Patient not taking: Reported on 06/05/2017) 10 tablet 0   No facility-administered medications prior to visit.      EXAM:  BP 108/68 (BP Location: Right Arm, Patient Position: Sitting, Cuff Size: Normal)   Pulse 62   Temp 98.4 F (36.9 C) (Oral)   Wt 197 lb 6.4 oz (89.5 kg)   BMI 27.53 kg/m   Body mass index is 27.53 kg/m. WDWN in NAD  quiet respirations; mildly congested  somewhat hoarse. Non toxic . HEENT: Normocephalic ;atraumatic , Eyes;  PERRL, EOMs  Full, lids and conjunctiva clear,,Ears: no deformities, canals nl, TM landmarks normal, Nose: no deformity or discharge but congested;face minimally tender ethmoid area  Mouth : OP clear without lesion or edema . Neck: Supple without adenopathy or masses or bruits Chest:  Clear to A&P without wheezes rales or rhonchi CV:  S1-S2 no gallops or murmurs peripheral perfusion is normal Skin :nl perfusion and no acute rashes     ASSESSMENT AND PLAN:  Discussed the following assessment and plan:  Acute recurrent sinusitis, unspecified  location  Dysfunction of Eustachian tube, unspecified laterality  Recurrent sinus infections At risk with   Head colds and secondary infections with  baro pressure risk and flying.   Will complete fmla form apparently tolerates Augmentin without rash past jan and a better  Med for sinusitis  Advise ongoing steroid nose spray during inflammation.  -Patient advised to return or notify health care team  if symptoms worsen ,persist or new concerns arise.  Patient Instructions  We can rx for sinusitis  Antibiotic and  Prednisone this time.  Stay on  Nasal cortisone  During seasons or when you have a cold every day .  This sometimes can prevent a  Flu  blown  Sinus infection .   Will  Work on [employer] form  on going.   Standley Brooking. Brevyn Ring M.D.

## 2017-06-05 NOTE — Patient Instructions (Addendum)
We can rx for sinusitis  Antibiotic and  Prednisone this time.  Stay on  Nasal cortisone  During seasons or when you have a cold every day .  This sometimes can prevent a  Flu  blown  Sinus infection .   Will  Work on [employer] form  on going.

## 2017-06-24 ENCOUNTER — Encounter: Payer: Self-pay | Admitting: Internal Medicine

## 2017-06-25 NOTE — Telephone Encounter (Signed)
FMLA form received back from [employer] Still insufficient information Form has been corrected Placed in red folder for signatures.   Please advise Dr Regis Bill, thanks.

## 2017-06-26 NOTE — Telephone Encounter (Signed)
Initialed th form  This is not a permanent continuous problem  .

## 2017-06-26 NOTE — Telephone Encounter (Signed)
Faxed back to [employer] at [phone] Pt notified that Form has been faxed back to [employer].  Nothing further needed.

## 2017-07-24 ENCOUNTER — Encounter: Payer: Self-pay | Admitting: Adult Health

## 2017-07-24 ENCOUNTER — Ambulatory Visit: Payer: 59 | Admitting: Adult Health

## 2017-07-24 VITALS — BP 110/70 | Temp 97.9°F | Wt 198.0 lb

## 2017-07-24 DIAGNOSIS — T7840XA Allergy, unspecified, initial encounter: Secondary | ICD-10-CM

## 2017-07-24 MED ORDER — PREDNISONE 20 MG PO TABS: 20.0000 mg | ORAL_TABLET | Freq: Every day | ORAL | 0 refills | Status: DC

## 2017-07-24 NOTE — Progress Notes (Signed)
Subjective:    Patient ID: Eric Haynes, male    DOB: 08/18/1980, 37 y.o.   MRN: [id number]  HPI 37 year old male who  has a past medical history of CHICKENPOX, HX OF (03/30/2007) and HERPES LABIALIS, HX OF (03/30/2007).  He is a patient of Dr. Regis Bill who I am seeing today for an acute issue of possible allergic reaction. He reports a week ago today his lips started swelling, he did not take anything and the edema resolved within a few hours. A few days later his lips started swelling again, he took a dose of benadryl and the swelling resolved quickly. Today he reports that his left nostril "almost became swollen shut". He has not taken any benadryl today. As the day has progressed he the swelling ha started to resolve.   Denies shortness or breath, wheezing, or feeling as though his throat is closing.   Denies any recent changes in diet, new soaps, shampoos, or detergents  Review of Systems See HPI   Past Medical History:  Diagnosis Date  . CHICKENPOX, HX OF 03/30/2007   Qualifier: Diagnosis of  By: Hulan Saas, CMA (AAMA), Quita Skye   . HERPES LABIALIS, HX OF 03/30/2007   Qualifier: Diagnosis of  By: Regis Bill MD, Standley Brooking     Social History   Socioeconomic History  . Marital status: Single    Spouse name: Not on file  . Number of children: Not on file  . Years of education: Not on file  . Highest education level: Not on file  Occupational History  . Not on file  Social Needs  . Financial resource strain: Not on file  . Food insecurity:    Worry: Not on file    Inability: Not on file  . Transportation needs:    Medical: Not on file    Non-medical: Not on file  Tobacco Use  . Smoking status: Never Smoker  . Smokeless tobacco: Never Used  Substance and Sexual Activity  . Alcohol use: Yes  . Drug use: No  . Sexual activity: Not on file  Lifestyle  . Physical activity:    Days per week: Not on file    Minutes per session: Not on file  . Stress: Not on file  Relationships  .  Social connections:    Talks on phone: Not on file    Gets together: Not on file    Attends religious service: Not on file    Active member of club or organization: Not on file    Attends meetings of clubs or organizations: Not on file    Relationship status: Not on file  . Intimate partner violence:    Fear of current or ex partner: Not on file    Emotionally abused: Not on file    Physically abused: Not on file    Forced sexual activity: Not on file  Other Topics Concern  . 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    No ets.     History reviewed. No pertinent surgical history.  History reviewed. No pertinent family history.  Allergies  Allergen Reactions  . Amoxicillin     REACTION: rash hsa been able to take  augmentin    Current Outpatient Medications on File Prior to Visit  Medication Sig Dispense Refill  . fluticasone (FLONASE) 50 MCG/ACT nasal spray Place 2 sprays into both nostrils daily. 16 g prn  .  predniSONE (DELTASONE) 20 MG tablet Take 1 tablet (20 mg total) by mouth 2 (two) times daily with a meal. (Patient not taking: Reported on 07/24/2017) 10 tablet 0   No current facility-administered medications on file prior to visit.     BP 110/70   Temp 97.9 F (36.6 C) (Oral)   Wt 198 lb (89.8 kg)   BMI 27.62 kg/m       Objective:   Physical Exam  Constitutional: He is oriented to person, place, and time. He appears well-developed and well-nourished. No distress.  HENT:  Right Ear: Hearing, tympanic membrane, external ear and ear canal normal.  Left Ear: Hearing, tympanic membrane, external ear and ear canal normal.  Nose: Mucosal edema (left nare) present.  Mouth/Throat: Uvula is midline, oropharynx is clear and moist and mucous membranes are normal.  Cardiovascular: Normal rate, regular rhythm, normal heart sounds and intact distal pulses. Exam reveals no gallop and no friction rub.  No murmur  heard. Pulmonary/Chest: Effort normal and breath sounds normal. No stridor. No respiratory distress. He has no wheezes. He has no rales. He exhibits no tenderness.  Musculoskeletal:  Trace swelling noted around left nare  Neurological: He is alert and oriented to person, place, and time.  Skin: Skin is warm and dry. Capillary refill takes less than 2 seconds. He is not diaphoretic.  Psychiatric: He has a normal mood and affect. His behavior is normal. Judgment and thought content normal.  Nursing note and vitals reviewed.     Assessment & Plan:  1. Allergic reaction, initial encounter - Will prescribe him 20 mg prednisone for 5 days due to allergic reaction symptoms  - Advised follow up with [facility] for formal allergy testing   Dorothyann Peng, NP

## 2017-08-17 DIAGNOSIS — T783XXA Angioneurotic edema, initial encounter: Secondary | ICD-10-CM | POA: Diagnosis not present

## 2017-08-17 DIAGNOSIS — J3089 Other allergic rhinitis: Secondary | ICD-10-CM | POA: Diagnosis not present

## 2017-10-30 IMAGING — DX DG WRIST COMPLETE 3+V*L*
4 series · 4 of 4 positions shown · non-contrast
Comparison: None.

CLINICAL DATA: Laceration by sheet metal to left wrist. Initial
encounter.

EXAM:
LEFT WRIST - COMPLETE 3+ VIEW

[x wrist pa left]
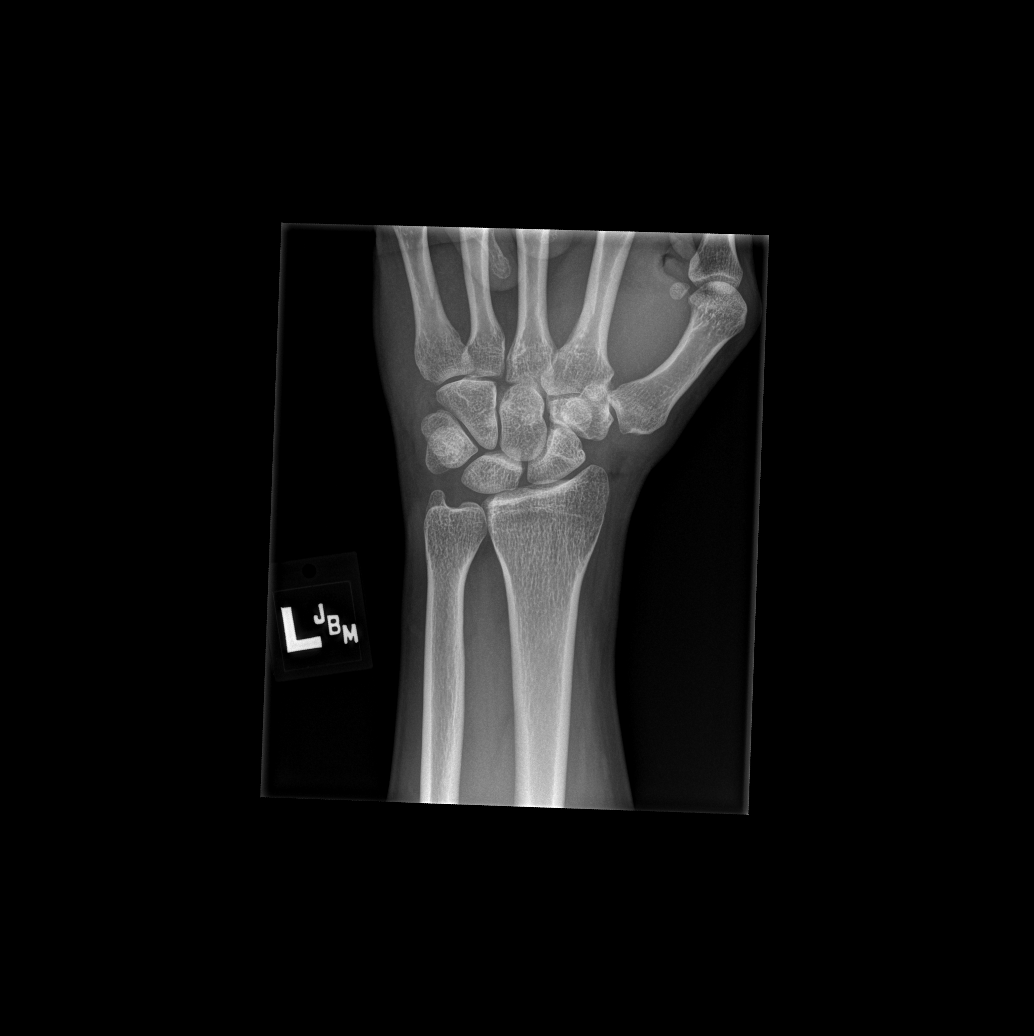

[x wrist obl left]
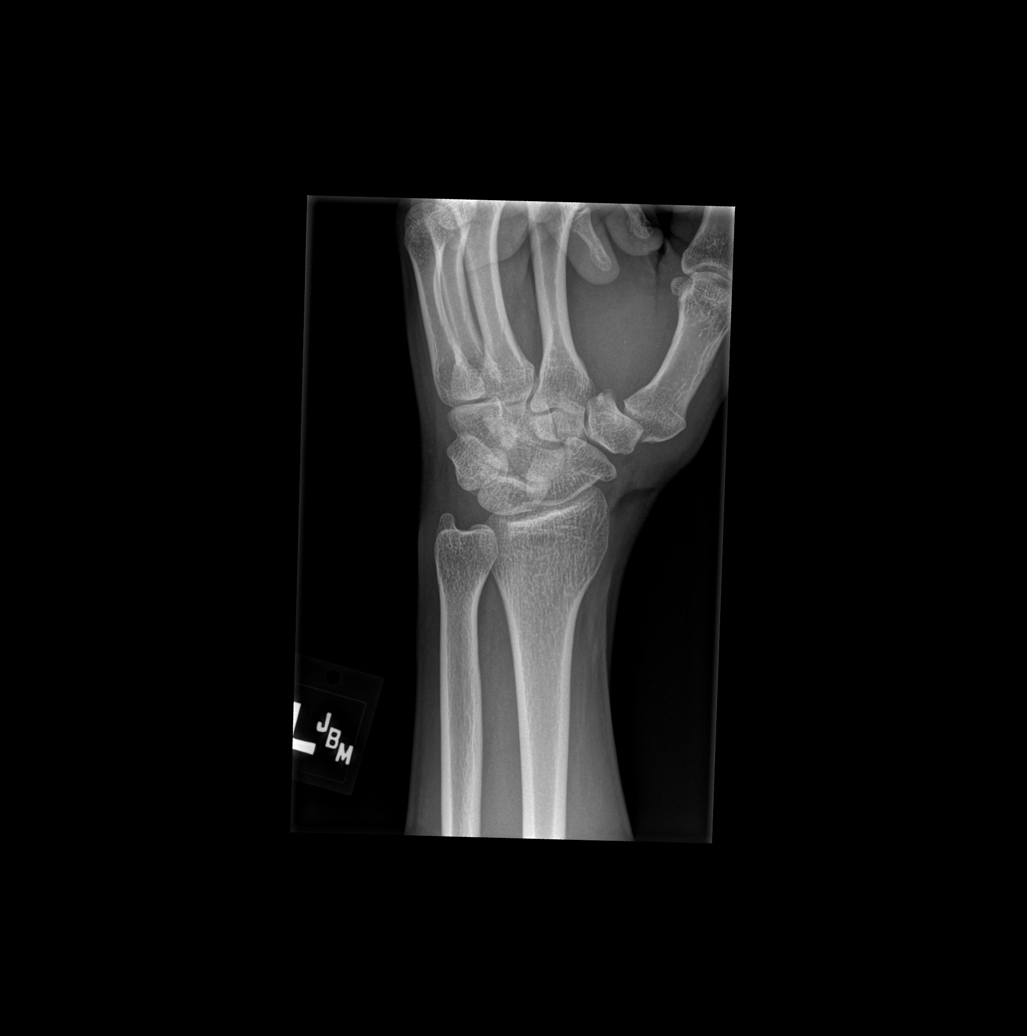

[x wrist lat left]
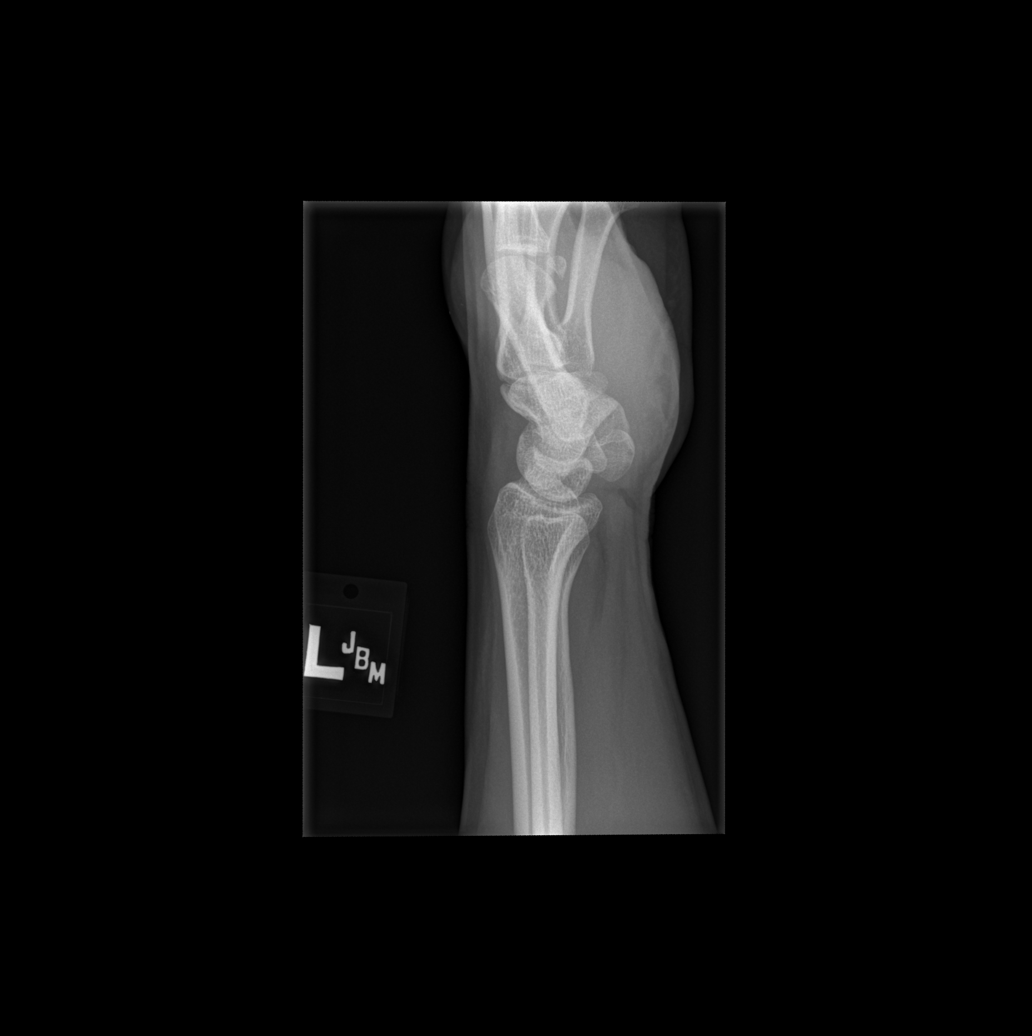

[x wrist navicular view left]
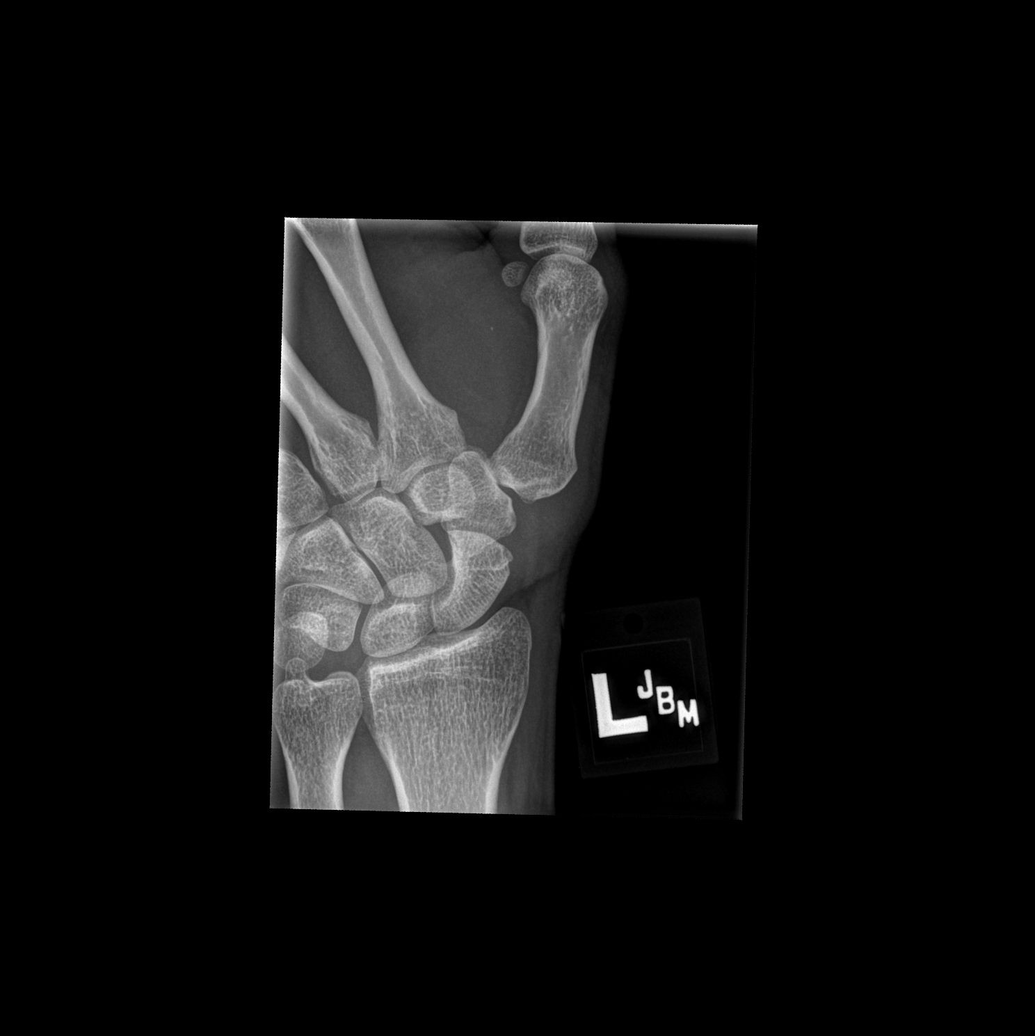

[4 of 4 positions shown; findings below may reference images not displayed]

FINDINGS: Soft tissue lucency visible likely corresponding to the clinical
laceration along the ulnar palmar aspect of the wrist near the base
of the thumb. No evidence of soft tissue foreign body. No fracture
identified. The depth of laceration does potentially extend close to
bone and the margin of the radiocarpal joint itself.
IMPRESSION: Soft tissue lucency corresponding to soft tissue laceration without
evidence of soft tissue foreign body or bony fracture. The depth of
soft tissue laceration does appear to extend close to the margin of
the joint/bone.

## 2017-11-16 DIAGNOSIS — J069 Acute upper respiratory infection, unspecified: Secondary | ICD-10-CM | POA: Diagnosis not present

## 2017-12-22 ENCOUNTER — Ambulatory Visit: Payer: 59 | Admitting: Family Medicine

## 2017-12-22 ENCOUNTER — Encounter

## 2017-12-22 ENCOUNTER — Encounter: Payer: Self-pay | Admitting: Family Medicine

## 2017-12-22 VITALS — BP 120/78 | HR 85 | Temp 97.8°F | Resp 12 | Ht 71.0 in | Wt 202.2 lb

## 2017-12-22 DIAGNOSIS — R0981 Nasal congestion: Secondary | ICD-10-CM | POA: Diagnosis not present

## 2017-12-22 DIAGNOSIS — J309 Allergic rhinitis, unspecified: Secondary | ICD-10-CM | POA: Diagnosis not present

## 2017-12-22 DIAGNOSIS — H1031 Unspecified acute conjunctivitis, right eye: Secondary | ICD-10-CM | POA: Diagnosis not present

## 2017-12-22 DIAGNOSIS — H6982 Other specified disorders of Eustachian tube, left ear: Secondary | ICD-10-CM | POA: Diagnosis not present

## 2017-12-22 MED ORDER — CIPROFLOXACIN HCL 0.3 % OP SOLN: 1.0000 [drp] | Freq: Four times a day (QID) | OPHTHALMIC | 0 refills | Status: AC

## 2017-12-22 MED ORDER — AMOXICILLIN-POT CLAVULANATE 875-125 MG PO TABS: 1.0000 | ORAL_TABLET | Freq: Two times a day (BID) | ORAL | 0 refills | Status: AC

## 2017-12-22 NOTE — Progress Notes (Signed)
ACUTE VISIT  HPI:  Chief Complaint  Patient presents with  . Nasal Congestion  . Otalgia    left ear pain  . Eye Drainage    right eye was crusty this morning    [name] is a 37 y.o.male here today complaining of 6 days of intermittent respiratory symptoms. According to patient, he has sinus infections twice per year and last antibiotic he was prescribed was the only treatment that helped with symptoms. Nasal congestion, rhinorrhea, postnasal drainage, productive cough with greenish sputum. He denies hemoptysis. Negative for fever, chills, or body aches. He denies sore throat, wheezing, or dyspnea.   Sinus Problem  This is a new problem. The current episode started in the past 7 days. The problem has been waxing and waning since onset. There has been no fever. His pain is at a severity of 0/10. He is experiencing no pain. Associated symptoms include congestion, coughing, ear pain and sinus pressure. Pertinent negatives include no chills, headaches, hoarse voice, neck pain, shortness of breath, sore throat or swollen glands. Past treatments include oral decongestants. The treatment provided no relief.    Left earache, no hearing loss.  No Hx of recent travel. No sick contact. No known insect bite.  Hx of allergies: He is not sure.  OTC medications for this problem: He is currently on prednisone and has taking OTC decongestants.  He is also complaining of right eye pruritus and "crusty" drainage noted this morning when he woke up. Mild conjunctival erythema. No sick contact for bacterial conjunctivitis. He denies photophobia or visual changes.    Review of Systems  Constitutional: Positive for fatigue. Negative for activity change, appetite change, chills and fever.  HENT: Positive for congestion, ear pain, postnasal drip, rhinorrhea and sinus pressure. Negative for hoarse voice, mouth sores, nosebleeds, sore throat, trouble swallowing and voice change.     Eyes: Positive for discharge, redness and itching.  Respiratory: Positive for cough. Negative for chest tightness, shortness of breath and wheezing.   Gastrointestinal: Negative for abdominal pain, diarrhea, nausea and vomiting.  Musculoskeletal: Negative for gait problem, myalgias and neck pain.  Skin: Negative for rash.  Allergic/Immunologic: Positive for environmental allergies.  Neurological: Negative for dizziness, weakness and headaches.      Current Outpatient Medications on File Prior to Visit  Medication Sig Dispense Refill  . fluticasone (FLONASE) 50 MCG/ACT nasal spray Place 2 sprays into both nostrils daily. 16 g prn   No current facility-administered medications on file prior to visit.      Past Medical History:  Diagnosis Date  . CHICKENPOX, HX OF 03/30/2007   Qualifier: Diagnosis of  By: Hulan Saas, CMA (AAMA), Quita Skye   . HERPES LABIALIS, HX OF 03/30/2007   Qualifier: Diagnosis of  By: Regis Bill MD, Standley Brooking    Allergies  Allergen Reactions  . Amoxicillin     REACTION: rash hsa been able to take  augmentin    Social History   Socioeconomic History  . Marital status: Single    Spouse name: Not on file  . Number of children: Not on file  . Years of education: Not on file  . Highest education level: Not on file  Occupational History  . Not on file  Social Needs  . Financial resource strain: Not on file  . Food insecurity:    Worry: Not on file    Inability: Not on file  . Transportation needs:    Medical: Not on file  Non-medical: Not on file  Tobacco Use  . Smoking status: Never Smoker  . Smokeless tobacco: Never Used  Substance and Sexual Activity  . Alcohol use: Yes  . Drug use: No  . Sexual activity: Not on file  Lifestyle  . Physical activity:    Days per week: Not on file    Minutes per session: Not on file  . Stress: Not on file  Relationships  . Social connections:    Talks on phone: Not on file    Gets together: Not on file     Attends religious service: Not on file    Active member of club or organization: Not on file    Attends meetings of clubs or organizations: Not on file    Relationship status: Not on file  Other Topics Concern  . 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    No ets.     Vitals:   12/22/17 1203  BP: 120/78  Pulse: 85  Resp: 12  Temp: 97.8 F (36.6 C)  SpO2: 96%   Body mass index is 28.21 kg/m.  Physical Exam  Constitutional: He is oriented to person, place, and time. He appears well-developed. He does not appear ill. No distress.  HENT:  Head: Atraumatic.  Right Ear: Tympanic membrane, external ear and ear canal normal.  Left Ear: Tympanic membrane, external ear and ear canal normal.  Nose: Rhinorrhea and septal deviation present.  Mouth/Throat: Oropharynx is clear and moist and mucous membranes are normal.  Mouth breathing. Hypertrophic turbinates. Postnasal drainage. Normal sinus transillumination. Mild tenderness upon sinus percussion.  Eyes: Pupils are equal, round, and reactive to light. EOM and lids are normal. Right conjunctiva is injected. Left conjunctiva is not injected.  Cardiovascular: Normal rate and regular rhythm.  No murmur heard. Respiratory: Effort normal and breath sounds normal. No stridor. No respiratory distress.  Lymphadenopathy:       Head (right side): No submandibular adenopathy present.       Head (left side): No submandibular adenopathy present.    He has no cervical adenopathy.  Neurological: He is alert and oriented to person, place, and time. He has normal strength. Gait normal.  Skin: Skin is warm. No rash noted. No erythema.  Psychiatric: He has a normal mood and affect. His speech is normal.  Well groomed, good eye contact.      ASSESSMENT AND PLAN:   Mr. Eric Haynes was seen today for nasal congestion, otalgia and eye drainage.  Diagnoses and all orders for this visit:  Nasal sinus  congestion Explained that I do not think he has a bacterial sinusitis at this time, so antibiotic is not recommended. He states that similar symptoms have been treated in the past as "sinus infection" and he has improved with antibiotics, he is insisting in antibiotic treatment. Allergy list amoxicillin listed but apparently he can take Augmentin with no problem. 10/2017 he completed Augmentin treatment, so prescription was sent to his pharmacy.  He is suggested to have different antibiotics prescriptions in case this one does not help.  Recommend following with PCP if symptoms have not improved in 1 to 2 weeks, before if he gets worse. Continue prednisone treatment on oral decongestant.  We discussed home side effects of frequent antibiotic use.  Allergic rhinitis, unspecified seasonality, unspecified trigger History and findings suggest allergy rhinitis. Recommend intranasal steroid daily as needed. Nasal saline irrigations several times per day.  Complete prednisone treatment. Follow-up with PCP as needed.  Dysfunction of left eustachian tube On examination today TM is not erythematous. Recommend auto inflation maneuvers at few times per day. Continue OTC decongestant daily as needed. 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  Acute conjunctivitis of right eye, unspecified acute conjunctivitis type It could be allergic or viral. Instructed about warning signs.  -     ciprofloxacin (CILOXAN) 0.3 % ophthalmic solution; Place 1 drop into the right eye every 6 (six) hours for 7 days.  Other orders -     amoxicillin-clavulanate (AUGMENTIN) 875-125 MG tablet; Take 1 tablet by mouth 2 (two) times daily for 7 days.     Geremiah Fussell G. [place], MD  [facility]. [place] office.

## 2017-12-22 NOTE — Patient Instructions (Addendum)
  [name] I have seen you today for an acute visit.  A few things to remember from today's visit:   Allergic rhinitis, unspecified seasonality, unspecified trigger  Dysfunction of left eustachian tube  I really do not think antibiotic is necessary at this time, I sent Augmentin to the pharmacy because this was the last prescription you completed and you reported that they helped. Plenty of saline nasal irrigations through the day. Intranasal steroid like Flonase daily recommended. Continue oral decongestant. Pop your ears a few times during the day, this may help with left earache.  Follow with PCP as needed.      In general please monitor for signs of worsening symptoms and seek immediate medical attention if any concerning.  If symptoms are not resolved in 1-2 weeks you should schedule a follow up appointment with your doctor, before if needed.  I hope you get better soon!

## 2018-01-17 DIAGNOSIS — S339XXA Sprain of unspecified parts of lumbar spine and pelvis, initial encounter: Secondary | ICD-10-CM | POA: Diagnosis not present

## 2018-02-22 NOTE — Progress Notes (Signed)
Chief Complaint  Patient presents with  . Jaw Pain    right side, x 1 week, hard to bite down, hard to close the mouth all the way, has not tried OTC meds    HPI: Eric Haynes 37 y.o. come in for  new problem   Onset  A few week  s ago while eating noted right jaw area soreness when biting down  Hs of jaw popping but not recently .  No dislocation but at one time hard to close mouth  Sore and then  Saturday ( 3 days ago .began to hurt  .  No trauma dental problems hx of teeth grinding recent orthodontic issues   ROS: See pertinent positives and negatives per HPI. No fever  Hx of same and no rx   Past Medical History:  Diagnosis Date  . CHICKENPOX, HX OF 03/30/2007   Qualifier: Diagnosis of  By: Hulan Saas, CMA (AAMA), Quita Skye   . HERPES LABIALIS, HX OF 03/30/2007   Qualifier: Diagnosis of  By: Regis Bill MD, Standley Brooking     No family history on file.  Social History   Socioeconomic History  . Marital status: Single    Spouse name: Not on file  . Number of children: Not on file  . Years of education: Not on file  . Highest education level: Not on file  Occupational History  . Not on file  Social Needs  . Financial resource strain: Not on file  . Food insecurity:    Worry: Not on file    Inability: Not on file  . Transportation needs:    Medical: Not on file    Non-medical: Not on file  Tobacco Use  . Smoking status: Never Smoker  . Smokeless tobacco: Never Used  Substance and Sexual Activity  . Alcohol use: Yes  . Drug use: No  . Sexual activity: Not on file  Lifestyle  . Physical activity:    Days per week: Not on file    Minutes per session: Not on file  . Stress: Not on file  Relationships  . Social connections:    Talks on phone: Not on file    Gets together: Not on file    Attends religious service: Not on file    Active member of club or organization: Not on file    Attends meetings of clubs or organizations: Not on file    Relationship status: Not on file    Other Topics Concern  . 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    No ets.     Outpatient Medications Prior to Visit  Medication Sig Dispense Refill  . fluticasone (FLONASE) 50 MCG/ACT nasal spray Place 2 sprays into both nostrils daily. 16 g prn   No facility-administered medications prior to visit.      EXAM:  BP 120/62 (BP Location: Left Arm, Patient Position: Sitting, Cuff Size: Normal)   Pulse 84   Temp 98 F (36.7 C) (Oral)   Wt 206 lb 12.8 oz (93.8 kg)   SpO2 98%   BMI 28.84 kg/m   Body mass index is 28.84 kg/m.  GENERAL: vitals reviewed and listed above, alert, oriented, appears well hydrated and in no acute distress HEENT: atraumatic, conjunctiva  clear, no obvious abnormalities on inspection of external nose and earstmx clear   tmp left click no dislocation   Tender inf to r tmj  OP :  no lesion edema or exudate  No dental pain NECK: no obvious masses on inspection palpation  MS: moves all extremities without noticeable focal  abnormality PSYCH: pleasant and cooperative, no obvious depression or anxiety  BP Readings from Last 3 Encounters:  02/23/18 120/62  12/22/17 120/78  07/24/17 110/70    ASSESSMENT AND PLAN:  Discussed the following assessment and plan:  Jaw pain  Need for influenza vaccination - Plan: Flu Vaccine QUAD 36+ mos IM Most likely a TMJ caused symptom without obvious insider. Expectant management relative job rest anti-inflammatories and time. If persistent progressive consider seeing dentist other as discussed.  No signs of infection today.  -Patient advised to return or notify health care team  if  new concerns arise.  Patient Instructions  This is  Probably TMJ    Can happen for many reasons . For now relative jaw rest  nsaid  Ibuprofen 800  Every 8 hours   Or 2 aleve  Twice a day   For 7- 10 days   If  persistent or progressive .    consider see dentist   .   Temporomandibular  Joint Syndrome  Temporomandibular joint syndrome (TMJ syndrome) is a condition that causes pain in the temporomandibular joints. These joints are located near your ears and allow your jaw to open and close. For people with TMJ syndrome, chewing, biting, or other movements of the jaw can be difficult or painful. TMJ syndrome is often mild and goes away within a few weeks. However, sometimes the condition becomes a long-term (chronic) problem. What are the causes? This condition may be caused by:  Grinding your teeth or clenching your jaw. Some people do this when they are under stress.  Arthritis.  Injury to the jaw.  Head or neck injury.  Teeth or dentures that are not aligned well. In some cases, the cause of TMJ syndrome may not be known. What are the signs or symptoms? The most common symptom of this condition is an aching pain on the side of the head in the area of the TMJ. Other symptoms may include:  Pain when moving your jaw, such as when chewing or biting.  Being unable to open your jaw all the way.  Making a clicking sound when you open your mouth.  Headache.  Earache.  Neck or shoulder pain. How is this diagnosed? This condition may be diagnosed based on:  Your symptoms and medical history.  A physical exam. Your health care provider may check the range of motion of your jaw.  Imaging tests, such as X-rays or an MRI. You may also need to see your dentist, who will determine if your teeth and jaw are lined up correctly. How is this treated? TMJ syndrome often goes away on its own. If treatment is needed, the options may include:  Eating soft foods and applying ice or heat.  Medicines to relieve pain or inflammation.  Medicines or massage to relax the muscles.  A splint, bite plate, or mouthpiece to prevent teeth grinding or jaw clenching.  Relaxation techniques or counseling to help reduce stress.  A therapy for pain in which an electrical current is  applied to the nerves through the skin (transcutaneous electrical nerve stimulation).  Acupuncture. This is sometimes helpful to relieve pain.  Jaw surgery. This is rarely needed. Follow these instructions at home:  Eating and drinking  Eat a soft diet if you are having trouble chewing.  Avoid foods that require a lot of chewing.  Do not chew gum. General instructions  Take over-the-counter and prescription medicines only as told by your health care provider.  If directed, put ice on the painful area. ? Put ice in a plastic bag. ? Place a towel between your skin and the bag. ? Leave the ice on for 20 minutes, 2-3 times a day.  Apply a warm, wet cloth (warm compress) to the painful area as directed.  Massage your jaw area and do any jaw stretching exercises as told by your health care provider.  If you were given a splint, bite plate, or mouthpiece, wear it as told by your health care provider.  Keep all follow-up visits as told by your health care provider. This is important. Contact a health care provider if:  You are having trouble eating.  You have new or worsening symptoms. Get help right away if:  Your jaw locks open or closed. Summary  Temporomandibular joint syndrome (TMJ syndrome) is a condition that causes pain in the temporomandibular joints. These joints are located near your ears and allow your jaw to open and close.  TMJ syndrome is often mild and goes away within a few weeks. However, sometimes the condition becomes a long-term (chronic) problem.  Symptoms include an aching pain on the side of the head in the area of the TMJ, pain when chewing or biting, and being unable to open your jaw all the way. You may also make a clicking sound when you open your mouth.  TMJ syndrome often goes away on its own. If treatment is needed, it may include medicines to relieve pain, reduce inflammation, or relax the muscles. A splint, bite plate, or mouthpiece may also be used  to prevent teeth grinding or jaw clenching. This information is not intended to replace advice given to you by your health care provider. Make sure you discuss any questions you have with your health care provider. Document Released: 11/12/2000 Document Revised: 03/31/2017 Document Reviewed: 03/31/2017 Elsevier Interactive Patient Education  2019 [employer] K. Panosh M.D.

## 2018-02-23 ENCOUNTER — Encounter: Payer: Self-pay | Admitting: Internal Medicine

## 2018-02-23 ENCOUNTER — Ambulatory Visit: Payer: 59 | Admitting: Internal Medicine

## 2018-02-23 VITALS — BP 120/62 | HR 84 | Temp 98.0°F | Wt 206.8 lb

## 2018-02-23 DIAGNOSIS — Z23 Encounter for immunization: Secondary | ICD-10-CM

## 2018-02-23 DIAGNOSIS — R6884 Jaw pain: Secondary | ICD-10-CM | POA: Diagnosis not present

## 2018-02-23 NOTE — Patient Instructions (Signed)
This is  Probably TMJ    Can happen for many reasons . For now relative jaw rest  nsaid  Ibuprofen 800  Every 8 hours   Or 2 aleve  Twice a day   For 7- 10 days   If  persistent or progressive .    consider see dentist   .   Temporomandibular Joint Syndrome  Temporomandibular joint syndrome (TMJ syndrome) is a condition that causes pain in the temporomandibular joints. These joints are located near your ears and allow your jaw to open and close. For people with TMJ syndrome, chewing, biting, or other movements of the jaw can be difficult or painful. TMJ syndrome is often mild and goes away within a few weeks. However, sometimes the condition becomes a long-term (chronic) problem. What are the causes? This condition may be caused by:  Grinding your teeth or clenching your jaw. Some people do this when they are under stress.  Arthritis.  Injury to the jaw.  Head or neck injury.  Teeth or dentures that are not aligned well. In some cases, the cause of TMJ syndrome may not be known. What are the signs or symptoms? The most common symptom of this condition is an aching pain on the side of the head in the area of the TMJ. Other symptoms may include:  Pain when moving your jaw, such as when chewing or biting.  Being unable to open your jaw all the way.  Making a clicking sound when you open your mouth.  Headache.  Earache.  Neck or shoulder pain. How is this diagnosed? This condition may be diagnosed based on:  Your symptoms and medical history.  A physical exam. Your health care provider may check the range of motion of your jaw.  Imaging tests, such as X-rays or an MRI. You may also need to see your dentist, who will determine if your teeth and jaw are lined up correctly. How is this treated? TMJ syndrome often goes away on its own. If treatment is needed, the options may include:  Eating soft foods and applying ice or heat.  Medicines to relieve pain or  inflammation.  Medicines or massage to relax the muscles.  A splint, bite plate, or mouthpiece to prevent teeth grinding or jaw clenching.  Relaxation techniques or counseling to help reduce stress.  A therapy for pain in which an electrical current is applied to the nerves through the skin (transcutaneous electrical nerve stimulation).  Acupuncture. This is sometimes helpful to relieve pain.  Jaw surgery. This is rarely needed. Follow these instructions at home:  Eating and drinking  Eat a soft diet if you are having trouble chewing.  Avoid foods that require a lot of chewing. Do not chew gum. General instructions  Take over-the-counter and prescription medicines only as told by your health care provider.  If directed, put ice on the painful area. ? Put ice in a plastic bag. ? Place a towel between your skin and the bag. ? Leave the ice on for 20 minutes, 2-3 times a day.  Apply a warm, wet cloth (warm compress) to the painful area as directed.  Massage your jaw area and do any jaw stretching exercises as told by your health care provider.  If you were given a splint, bite plate, or mouthpiece, wear it as told by your health care provider.  Keep all follow-up visits as told by your health care provider. This is important. Contact a health care provider if:  You  are having trouble eating.  You have new or worsening symptoms. Get help right away if:  Your jaw locks open or closed. Summary  Temporomandibular joint syndrome (TMJ syndrome) is a condition that causes pain in the temporomandibular joints. These joints are located near your ears and allow your jaw to open and close.  TMJ syndrome is often mild and goes away within a few weeks. However, sometimes the condition becomes a long-term (chronic) problem.  Symptoms include an aching pain on the side of the head in the area of the TMJ, pain when chewing or biting, and being unable to open your jaw all the way. You  may also make a clicking sound when you open your mouth.  TMJ syndrome often goes away on its own. If treatment is needed, it may include medicines to relieve pain, reduce inflammation, or relax the muscles. A splint, bite plate, or mouthpiece may also be used to prevent teeth grinding or jaw clenching. This information is not intended to replace advice given to you by your health care provider. Make sure you discuss any questions you have with your health care provider. Document Released: 11/12/2000 Document Revised: 03/31/2017 Document Reviewed: 03/31/2017 Elsevier Interactive Patient Education  2019 [employer].

## 2018-06-30 DIAGNOSIS — M25512 Pain in left shoulder: Secondary | ICD-10-CM | POA: Diagnosis not present

## 2018-11-16 ENCOUNTER — Other Ambulatory Visit: Payer: Self-pay

## 2018-11-16 ENCOUNTER — Ambulatory Visit (INDEPENDENT_AMBULATORY_CARE_PROVIDER_SITE_OTHER): Payer: 59

## 2018-11-16 DIAGNOSIS — Z23 Encounter for immunization: Secondary | ICD-10-CM

## 2018-12-07 ENCOUNTER — Ambulatory Visit: Payer: 59

## 2019-05-13 ENCOUNTER — Ambulatory Visit: Payer: 59 | Attending: Internal Medicine

## 2019-05-13 DIAGNOSIS — Z23 Encounter for immunization: Secondary | ICD-10-CM

## 2019-05-13 NOTE — Progress Notes (Signed)
   Covid-19 Vaccination Clinic  Name:  Dakar Stegner    MRN: [id number] DOB: [birth year]  05/13/2019  Mr. Nabozny was observed post Covid-19 immunization for 15 minutes without incident. He was provided with Vaccine Information Sheet and instruction to access the V-Safe system.   Mr. Burno was instructed to call 911 with any severe reactions post vaccine: Marland Kitchen Difficulty breathing  . Swelling of face and throat  . A fast heartbeat  . A bad rash all over body  . Dizziness and weakness   Immunizations Administered    Name Date Dose VIS Date Route   Pfizer COVID-19 Vaccine 05/13/2019 10:35 AM 0.3 mL 02/11/2019 Intramuscular   Manufacturer: [place]   Lot: [id number]   [place]: [id number]

## 2019-06-06 ENCOUNTER — Ambulatory Visit: Payer: 59 | Attending: Internal Medicine

## 2019-06-06 DIAGNOSIS — Z23 Encounter for immunization: Secondary | ICD-10-CM

## 2019-06-06 NOTE — Progress Notes (Signed)
   Covid-19 Vaccination Clinic  Name:  Eric Haynes    MRN: [id number] DOB: September 17, 1980  06/06/2019  Mr. Eric Haynes was observed post Covid-19 immunization for 15 minutes without incident. He was provided with Vaccine Information Sheet and instruction to access the V-Safe system.   Mr. Eric Haynes was instructed to call 911 with any severe reactions post vaccine: Marland Kitchen Difficulty breathing  . Swelling of face and throat  . A fast heartbeat  . A bad rash all over body  . Dizziness and weakness   Immunizations Administered    Name Date Dose VIS Date Route   Pfizer COVID-19 Vaccine 06/06/2019  9:49 AM 0.3 mL 02/11/2019 Intramuscular   Manufacturer: [place]   Lot: [id number]   [place]: [id number]

## 2019-12-26 ENCOUNTER — Encounter: Payer: Self-pay | Admitting: Internal Medicine

## 2019-12-26 ENCOUNTER — Ambulatory Visit: Payer: 59 | Admitting: Internal Medicine

## 2019-12-26 DIAGNOSIS — Z23 Encounter for immunization: Secondary | ICD-10-CM

## 2020-12-05 ENCOUNTER — Ambulatory Visit (INDEPENDENT_AMBULATORY_CARE_PROVIDER_SITE_OTHER): Payer: 59 | Admitting: *Deleted

## 2020-12-05 ENCOUNTER — Other Ambulatory Visit: Payer: Self-pay

## 2020-12-05 DIAGNOSIS — Z23 Encounter for immunization: Secondary | ICD-10-CM | POA: Diagnosis not present

## 2021-12-04 ENCOUNTER — Ambulatory Visit (INDEPENDENT_AMBULATORY_CARE_PROVIDER_SITE_OTHER): Payer: Commercial Managed Care - PPO

## 2021-12-04 ENCOUNTER — Ambulatory Visit: Payer: 59

## 2021-12-04 DIAGNOSIS — Z23 Encounter for immunization: Secondary | ICD-10-CM

## 2021-12-12 ENCOUNTER — Ambulatory Visit (INDEPENDENT_AMBULATORY_CARE_PROVIDER_SITE_OTHER): Payer: Commercial Managed Care - PPO | Admitting: Adult Health

## 2021-12-12 VITALS — BP 100/80 | HR 94 | Temp 98.0°F | Ht 71.0 in | Wt 190.0 lb

## 2021-12-12 DIAGNOSIS — J0100 Acute maxillary sinusitis, unspecified: Secondary | ICD-10-CM

## 2021-12-12 MED ORDER — AZITHROMYCIN 250 MG PO TABS: ORAL_TABLET | ORAL | 0 refills | Status: AC

## 2021-12-12 MED ORDER — PREDNISONE 10 MG PO TABS: 10.0000 mg | ORAL_TABLET | Freq: Every day | ORAL | 0 refills | Status: DC

## 2021-12-12 NOTE — Progress Notes (Signed)
Subjective:    Patient ID: Eric Haynes, male    DOB: 1980-12-18, 41 y.o.   MRN: [id number]  HPI  41 year old male who  has a past medical history of CHICKENPOX, HX OF (03/30/2007) and HERPES LABIALIS, HX OF (03/30/2007).  He presents to the office today for an acute issue.  His symptoms started 6 days ago.  Symptoms include somewhat productive cough, nasal congestion, rhinorrhea, and sinus pressure.  He did have a telehealth visit 4 days ago he was prescribed a 7-day course of Augmentin.  He is halfway through this and is not improving.  He has not had any fevers or chills since starting the antibiotic.  Denies shortness of breath or wheezing.  He has developed diarrhea since starting Augmentin.  Review of Systems See HPI   Past Medical History:  Diagnosis Date   CHICKENPOX, HX OF 03/30/2007   Qualifier: Diagnosis of  By: Hulan Saas, CMA (AAMA), Larene Beach S    HERPES LABIALIS, HX OF 03/30/2007   Qualifier: Diagnosis of  By: Regis Bill MD, Standley Brooking     Social History   Socioeconomic History   Marital status: Married    Spouse name: Not on file   Number of children: Not on file   Years of education: Not on file   Highest education level: Not on file  Occupational History   Not on file  Tobacco Use   Smoking status: Never   Smokeless tobacco: Never  Substance and Sexual Activity   Alcohol use: Yes   Drug use: No   Sexual activity: Not on file  Other Topics Concern   Not on file  Social History Narrative   [employer]   Hh of 3 married  Toddler in day care.    No ets.    Social Determinants of Health   Financial Resource Strain: Not on file  Food Insecurity: Not on file  Transportation Needs: Not on file  Physical Activity: Not on file  Stress: Not on file  Social Connections: Not on file  Intimate Partner Violence: Not on file    No past surgical history on file.  No family history on file.  Allergies  Allergen Reactions   Amoxicillin     REACTION: rash hsa been able to  take  augmentin    Current Outpatient Medications on File Prior to Visit  Medication Sig Dispense Refill   fluticasone (FLONASE) 50 MCG/ACT nasal spray Place 2 sprays into both nostrils daily. 16 g prn   amoxicillin-clavulanate (AUGMENTIN) 875-125 MG tablet SMARTSIG:1 Tablet(s) By Mouth Every 12 Hours     No current facility-administered medications on file prior to visit.    BP 100/80   Pulse 94   Temp 98 F (36.7 C) (Oral)   Ht '5\' 11"'$  (1.803 m)   Wt 190 lb (86.2 kg)   SpO2 95%   BMI 26.50 kg/m       Objective:   Physical Exam Vitals and nursing note reviewed.  Constitutional:      Appearance: Normal appearance.  HENT:     Nose: Congestion and rhinorrhea present.     Right Turbinates: Enlarged and swollen.     Left Turbinates: Enlarged and swollen.     Right Sinus: Maxillary sinus tenderness present.     Left Sinus: Maxillary sinus tenderness present.  Cardiovascular:     Rate and Rhythm: Normal rate and regular rhythm.     Pulses: Normal pulses.     Heart sounds: Normal heart sounds.  Pulmonary:     Effort: Pulmonary effort is normal.     Breath sounds: Normal breath sounds.  Abdominal:     General: Abdomen is flat.     Palpations: Abdomen is soft.  Musculoskeletal:        General: Normal range of motion.  Skin:    General: Skin is warm and dry.  Neurological:     General: No focal deficit present.     Mental Status: He is alert.  Psychiatric:        Mood and Affect: Mood normal.        Behavior: Behavior normal.        Thought Content: Thought content normal.       Assessment & Plan:  1. Acute non-recurrent maxillary sinusitis -We will prescribe a short course of prednisone as he is quite congested in his nasal cavities.  Encouraged to stay on Augmentin until finished.  He is traveling out of the country in the near future, will provide a printed prescription for a Z-Pak that he can use if his symptoms are not resolved by the end of Augmentin course. -  predniSONE (DELTASONE) 10 MG tablet; Take 1 tablet (10 mg total) by mouth daily with breakfast.  Dispense: 5 tablet; Refill: 0 - azithromycin (ZITHROMAX) 250 MG tablet; Take 2 tablets on day 1, then 1 tablet daily on days 2 through 5  Dispense: 6 tablet; Refill: 0  Dorothyann Peng, NP

## 2021-12-30 ENCOUNTER — Ambulatory Visit (INDEPENDENT_AMBULATORY_CARE_PROVIDER_SITE_OTHER): Payer: Commercial Managed Care - PPO | Admitting: Family Medicine

## 2021-12-30 ENCOUNTER — Encounter: Payer: Self-pay | Admitting: Family Medicine

## 2021-12-30 VITALS — BP 106/62 | HR 90 | Temp 98.4°F | Wt 189.0 lb

## 2021-12-30 DIAGNOSIS — J019 Acute sinusitis, unspecified: Secondary | ICD-10-CM

## 2021-12-30 MED ORDER — AZITHROMYCIN 250 MG PO TABS: ORAL_TABLET | ORAL | 0 refills | Status: DC

## 2021-12-30 MED ORDER — METHYLPREDNISOLONE 4 MG PO TBPK: ORAL_TABLET | ORAL | 0 refills | Status: DC

## 2021-12-30 NOTE — Progress Notes (Signed)
   Subjective:    Patient ID: Eric Haynes, male    DOB: 1980-11-25, 41 y.o.   MRN: [id number]  HPI Here to follow up on a sinus infection. He was seen here on 12-12-21 with a sinusitis, and at that time he was part way through a course of Augmentin. He was told to finish out the Augmentin, and this was followed by a Zpack. He was also given some Prednisone. He says his symptoms have almost completely resolved and he feels better. However he will be leaving for a 12 day cruise later this week, and he asks if he can take some more antibiotics with him in case he gets worse again. No fever.    Review of Systems  Constitutional: Negative.   HENT:  Negative for congestion, ear pain, postnasal drip, sinus pressure and sore throat.   Eyes: Negative.   Respiratory: Negative.         Objective:   Physical Exam Constitutional:      Appearance: Normal appearance. He is not ill-appearing.  HENT:     Right Ear: Tympanic membrane, ear canal and external ear normal.     Left Ear: Tympanic membrane, ear canal and external ear normal.     Nose: Nose normal.     Mouth/Throat:     Pharynx: Oropharynx is clear.  Eyes:     Conjunctiva/sclera: Conjunctivae normal.  Pulmonary:     Effort: Pulmonary effort is normal.     Breath sounds: Normal breath sounds.  Lymphadenopathy:     Cervical: No cervical adenopathy.  Neurological:     Mental Status: He is alert.           Assessment & Plan:  He seems to be over the sinusitis, bit we will provide him with another Zpack and a Medrol dose pack to take with him in case he gets sick again.  Alysia Penna, MD

## 2022-03-12 ENCOUNTER — Telehealth (INDEPENDENT_AMBULATORY_CARE_PROVIDER_SITE_OTHER): Payer: Commercial Managed Care - PPO | Admitting: Family Medicine

## 2022-03-12 ENCOUNTER — Encounter: Payer: Self-pay | Admitting: Family Medicine

## 2022-03-12 VITALS — Temp 101.0°F

## 2022-03-12 DIAGNOSIS — R509 Fever, unspecified: Secondary | ICD-10-CM

## 2022-03-12 DIAGNOSIS — R059 Cough, unspecified: Secondary | ICD-10-CM

## 2022-03-12 DIAGNOSIS — J101 Influenza due to other identified influenza virus with other respiratory manifestations: Secondary | ICD-10-CM | POA: Diagnosis not present

## 2022-03-12 LAB — POC COVID19 BINAXNOW: SARS Coronavirus 2 Ag: NEGATIVE

## 2022-03-12 LAB — POCT INFLUENZA A/B
Influenza A, POC: POSITIVE — AB
Influenza B, POC: NEGATIVE

## 2022-03-12 MED ORDER — OSELTAMIVIR PHOSPHATE 75 MG PO CAPS: 75.0000 mg | ORAL_CAPSULE | Freq: Two times a day (BID) | ORAL | 0 refills | Status: DC

## 2022-03-12 MED ORDER — PREDNISONE 10 MG PO TABS: ORAL_TABLET | ORAL | 0 refills | Status: DC

## 2022-03-12 NOTE — Progress Notes (Signed)
Patient ID: Eric Haynes, male   DOB: 06-10-1980, 42 y.o.   MRN: [id number]   Virtual Visit via Video Note  I connected with Arturo Morton on 03/12/22 at  3:00 PM EST by a video enabled telemedicine application and verified that I am speaking with the correct person using two identifiers.  Location patient: home Location provider:work or home office Persons participating in the virtual visit: patient, provider  I discussed the limitations of evaluation and management by telemedicine and the availability of in person appointments. The patient expressed understanding and agreed to proceed.   HPI: Marguis had onset late Monday of some mild diarrhea.  By Tuesday he noticed some increased congestion and had fever later that day along with increased body aches and headache.  Only mild cough.  No known sick contacts.  No vomiting.  No dyspnea.  Influenza vaccine back in October  Past Medical History:  Diagnosis Date   CHICKENPOX, HX OF 03/30/2007   Qualifier: Diagnosis of  By: Hulan Saas, CMA (AAMA), Larene Beach S    HERPES LABIALIS, HX OF 03/30/2007   Qualifier: Diagnosis of  By: Regis Bill MD, Standley Brooking    History reviewed. No pertinent surgical history.  reports that he has never smoked. He has never used smokeless tobacco. He reports current alcohol use. He reports that he does not use drugs. family history is not on file. Allergies  Allergen Reactions   Amoxicillin     REACTION: rash hsa been able to take  augmentin      ROS: See pertinent positives and negatives per HPI.  Past Medical History:  Diagnosis Date   CHICKENPOX, HX OF 03/30/2007   Qualifier: Diagnosis of  By: Hulan Saas, CMA (AAMA), Larene Beach S    HERPES LABIALIS, HX OF 03/30/2007   Qualifier: Diagnosis of  By: Regis Bill MD, Standley Brooking     History reviewed. No pertinent surgical history.  History reviewed. No pertinent family history.  SOCIAL HX: Non-smoker   Current Outpatient Medications:    oseltamivir (TAMIFLU) 75 MG capsule, Take 1  capsule (75 mg total) by mouth 2 (two) times daily., Disp: 10 capsule, Rfl: 0   predniSONE (DELTASONE) 10 MG tablet, Taper as follows: 6-5-4-3-2-1, Disp: 21 tablet, Rfl: 0   fluticasone (FLONASE) 50 MCG/ACT nasal spray, Place 2 sprays into both nostrils daily., Disp: 16 g, Rfl: prn  EXAM:  VITALS per patient if applicable:  GENERAL: alert, oriented, appears well and in no acute distress  HEENT: atraumatic, conjunttiva clear, no obvious abnormalities on inspection of external nose and ears  NECK: normal movements of the head and neck  LUNGS: on inspection no signs of respiratory distress, breathing rate appears normal, no obvious gross SOB, gasping or wheezing  CV: no obvious cyanosis  MS: moves all visible extremities without noticeable abnormality  PSYCH/NEURO: pleasant and cooperative, no obvious depression or anxiety, speech and thought processing grossly intact  ASSESSMENT AND PLAN:  Discussed the following assessment and plan:  Cough, unspecified type - Plan: POC COVID-19, POC Influenza A/B  Fever, unspecified fever cause - Plan: POC COVID-19, POC Influenza A/B  COVID testing negative.  Influenza A positive. -Start Tamiflu 75 mg by mouth twice daily for 5 days -Patient requesting prednisone taper which has helped with his congestion in the past.  Will send in prednisone taper.  Plenty fluids and rest.  Continue over-the-counter analgesics as needed.    I discussed the assessment and treatment plan with the patient. The patient was provided an opportunity to ask questions and  all were answered. The patient agreed with the plan and demonstrated an understanding of the instructions.   The patient was advised to call back or seek an in-person evaluation if the symptoms worsen or if the condition fails to improve as anticipated.     Carolann Littler, MD

## 2022-04-15 ENCOUNTER — Encounter: Payer: Self-pay | Admitting: Physician Assistant

## 2022-05-09 ENCOUNTER — Encounter: Payer: Self-pay | Admitting: Gastroenterology

## 2022-05-16 ENCOUNTER — Ambulatory Visit: Payer: Commercial Managed Care - PPO | Admitting: Physician Assistant

## 2022-07-08 ENCOUNTER — Other Ambulatory Visit: Payer: Self-pay | Admitting: Gastroenterology

## 2022-07-08 ENCOUNTER — Encounter: Payer: Self-pay | Admitting: Gastroenterology

## 2022-07-08 ENCOUNTER — Ambulatory Visit (INDEPENDENT_AMBULATORY_CARE_PROVIDER_SITE_OTHER): Payer: Commercial Managed Care - PPO | Admitting: Gastroenterology

## 2022-07-08 VITALS — BP 102/74 | HR 82 | Ht 71.0 in | Wt 198.0 lb

## 2022-07-08 DIAGNOSIS — Z83719 Family history of colon polyps, unspecified: Secondary | ICD-10-CM

## 2022-07-08 MED ORDER — SUTAB 1479-225-188 MG PO TABS: 24.0000 | ORAL_TABLET | Freq: Once | ORAL | 0 refills | Status: AC

## 2022-07-08 NOTE — Patient Instructions (Addendum)
_______________________________________________________  If your blood pressure at your visit was 140/90 or greater, please contact your primary care physician to follow up on this.  _______________________________________________________  If you are age 44 or older, your body mass index should be between 23-30. Your Body mass index is 27.62 kg/m. If this is out of the aforementioned range listed, please consider follow up with your Primary Care Provider.  If you are age 73 or younger, your body mass index should be between 19-25. Your Body mass index is 27.62 kg/m. If this is out of the aformentioned range listed, please consider follow up with your Primary Care Provider.   You have been scheduled for a colonoscopy. Please follow written instructions given to you at your visit today.  Please pick up your prep supplies at the pharmacy within the next 1-3 days. If you use inhalers (even only as needed), please bring them with you on the day of your procedure.   ________________________________________________________  The [facility] providers would like to encourage you to use [facility] to communicate with providers for non-urgent requests or questions.  Due to long hold times on the telephone, sending your provider a message by [facility] may be a faster and more efficient way to get a response.  Please allow 48 business hours for a response.  Please remember that this is for non-urgent requests.   It was a pleasure to see you today!  Thank you for trusting me with your gastrointestinal care!    Scott E.Tomasa Rand, MD

## 2022-07-08 NOTE — Progress Notes (Signed)
HPI : Eric Haynes is a very pleasant 42 year old male with no significant history who is referred to [place] by Dr. Berniece Andreas for consideration of early screening colonoscopy.  The patient's brother underwent initial laboratory screening colonoscopy last year at the age of 91.  Patient states that his brother was found to have numerous polyps, including a large one which required a separate colonoscopy to remove and his gastroenterologist recommended that his first-degree relatives undergo early colon cancer screening.  He has no family history of colon cancer.  His sister was diagnosed with breast cancer. The patient denies any chronic GI symptoms.  He has seen blood on the toilet paper, several years ago.  Otherwise, he has regular bowel movements with formed brown stool, no problems with constipation or diarrhea. He denies any chronic upper GI symptoms to include frequent heartburn/acid regurgitation, nausea/vomiting or dysphagia.    Past Medical History:  Diagnosis Date   CHICKENPOX, HX OF 03/30/2007   Qualifier: Diagnosis of  By: Lawernce Ion, CMA (AAMA), Shannon S    HERPES LABIALIS, HX OF 03/30/2007   Qualifier: Diagnosis of  By: Fabian Sharp MD, Neta Mends      No past surgical history on file. Family History  Problem Relation Age of Onset   Diabetes Father    Colon polyps Brother    Social History   Tobacco Use   Smoking status: Never   Smokeless tobacco: Never  Substance Use Topics   Alcohol use: Yes   Drug use: No   Current Outpatient Medications  Medication Sig Dispense Refill   fluticasone (FLONASE) 50 MCG/ACT nasal spray Place 2 sprays into both nostrils daily. 16 g prn   No current facility-administered medications for this visit.   Allergies  Allergen Reactions   Amoxicillin     REACTION: rash hsa been able to take  augmentin     Review of Systems: All systems reviewed and negative except where noted in HPI.    No results found.  Physical Exam: BP 102/74   Pulse  82   Ht 5\' 11"  (1.803 m)   Wt 198 lb (89.8 kg)   BMI 27.62 kg/m  Constitutional: Pleasant,well-developed, Caucasian male in no acute distress. HEENT: Normocephalic and atraumatic. Conjunctivae are normal. No scleral icterus. Neck supple.  Cardiovascular: Normal rate, regular rhythm.  Pulmonary/chest: Effort normal and breath sounds normal. No wheezing, rales or rhonchi. Abdominal: Soft, nondistended, nontender. Bowel sounds active throughout. There are no masses palpable. No hepatomegaly. Extremities: no edema Neurological: Alert and oriented to person place and time. Skin: Skin is warm and dry. No rashes noted. Psychiatric: Normal mood and affect. Behavior is normal.  CBC    Component Value Date/Time   WBC 4.2 (L) 08/14/2009 0809   RBC 4.91 08/14/2009 0809   HGB 15.3 08/14/2009 0809   HCT 44.2 08/14/2009 0809   PLT 161.0 08/14/2009 0809   MCV 90.0 08/14/2009 0809   MCHC 34.5 08/14/2009 0809   RDW 13.2 08/14/2009 0809   LYMPHSABS 1.5 08/14/2009 0809   MONOABS 0.3 08/14/2009 0809   EOSABS 0.2 08/14/2009 0809   BASOSABS 0.0 08/14/2009 0809    CMP     Component Value Date/Time   NA 141 08/14/2009 0809   K 4.4 08/14/2009 0809   CL 107 08/14/2009 0809   CO2 26 08/14/2009 0809   GLUCOSE 90 08/14/2009 0809   BUN 14 08/14/2009 0809   CREATININE 0.9 08/14/2009 0809   CALCIUM 9.1 08/14/2009 0809   PROT 6.6 08/14/2009 0809  ALBUMIN 4.4 08/14/2009 0809   AST 26 08/14/2009 0809   ALT 20 08/14/2009 0809   ALKPHOS 54 08/14/2009 0809   BILITOT 0.6 08/14/2009 0809   GFRNONAA 104.61 08/14/2009 0809     ASSESSMENT AND PLAN: 42 year old male with brother who was recently found to have an advanced colon polyp on routine screening at age 43.  Although I do not have the details of his brothers colonoscopy, it sounds like this polyp was not cancerous but it is likely that it would have turned into cancer over time.  Therefore, I would consider the patient to be high risk for colon  cancer and agree with early screening and would recommend 5-year interval colonoscopy, even within normal index screening.  Family history of colon polyp - Colonoscopy  The details, risks (including bleeding, perforation, infection, missed lesions, medication reactions and possible hospitalization or surgery if complications occur), benefits, and alternatives to colonoscopy with possible biopsy and possible polypectomy were discussed with the patient and he consents to proceed.   Chonita Gadea E. Tomasa Rand, MD [facility]   CC:  Fabian Sharp Neta Mends, MD

## 2022-07-17 ENCOUNTER — Ambulatory Visit: Payer: Commercial Managed Care - PPO | Admitting: Adult Health

## 2022-07-17 ENCOUNTER — Ambulatory Visit (INDEPENDENT_AMBULATORY_CARE_PROVIDER_SITE_OTHER): Payer: Commercial Managed Care - PPO | Admitting: Family Medicine

## 2022-07-17 ENCOUNTER — Encounter: Payer: Self-pay | Admitting: Family Medicine

## 2022-07-17 VITALS — BP 100/60 | HR 100 | Temp 98.0°F | Wt 199.0 lb

## 2022-07-17 DIAGNOSIS — J320 Chronic maxillary sinusitis: Secondary | ICD-10-CM

## 2022-07-17 DIAGNOSIS — H66002 Acute suppurative otitis media without spontaneous rupture of ear drum, left ear: Secondary | ICD-10-CM

## 2022-07-17 MED ORDER — PREDNISONE 10 MG PO TABS: ORAL_TABLET | ORAL | 0 refills | Status: DC

## 2022-07-17 MED ORDER — AZITHROMYCIN 250 MG PO TABS: ORAL_TABLET | ORAL | 0 refills | Status: AC

## 2022-07-17 NOTE — Progress Notes (Signed)
   Established Patient Office Visit   Subjective  Patient ID: Eric Haynes, male    DOB: 12-14-1980  Age: 42 y.o. MRN: [id number]  Chief Complaint  Patient presents with   Nasal Congestion    Head cold and congestion with cough for 3 weeks. Has been taking otc treatment, seemed to be getting better, but then got worse this week. Ears have also been feeling stopped up.     Patient is a 42 year old male followed by Dr. Fabian Sharp and seen for acute concern.  Patient endorses nasal congestion, sinus drainage, ears feeling stopped up x 3 weeks.  Patient states symptoms seem to improve then returned/became worse this week.  Pt had an e-visit while out of state, given a nasal spray.  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  Patient requesting prednisone.  Changes in altitude cause ears to feel worse.       ROS Negative unless stated above    Objective:     BP 100/60 (BP Location: Right Arm, Patient Position: Sitting, Cuff Size: Normal)   Pulse 100   Temp 98 F (36.7 C) (Oral)   Wt 199 lb (90.3 kg)   SpO2 95%   BMI 27.75 kg/m    Physical Exam Constitutional:      General: He is not in acute distress.    Appearance: Normal appearance.  HENT:     Head: Normocephalic and atraumatic.     Left Ear: Tympanic membrane is erythematous and bulging.     Nose:     Right Sinus: Maxillary sinus tenderness present.     Left Sinus: Maxillary sinus tenderness present.     Mouth/Throat:     Mouth: Mucous membranes are moist.  Cardiovascular:     Rate and Rhythm: Normal rate and regular rhythm.     Heart sounds: Normal heart sounds. No murmur heard.    No gallop.  Pulmonary:     Effort: Pulmonary effort is normal. No respiratory distress.     Breath sounds: Normal breath sounds. No wheezing, rhonchi or rales.  Skin:    General: Skin is warm and dry.  Neurological:     Mental Status: He is alert and oriented to person, place, and time.     No results  found for any visits on 07/17/22.    Assessment & Plan:  Maxillary sinusitis, unspecified chronicity -     Azithromycin; Take 2 tablets on day 1, then 1 tablet daily on days 2 through 5  Dispense: 6 tablet; Refill: 0 -     predniSONE; Take 5 tabs on day 1, 4 tabs on day 2, 3 tabs on day 3, 2 tabs on day 4, 1 tab on day 5.  Dispense: 15 tablet; Refill: 0  Acute suppurative otitis media of left ear without spontaneous rupture of tympanic membrane, recurrence not specified -     Azithromycin; Take 2 tablets on day 1, then 1 tablet daily on days 2 through 5  Dispense: 6 tablet; Refill: 0  Start ABX for AOM and sinusitis.  Allergies reviewed.  Prednisone taper.  Okay to use nasal spray/antihistamine.  Return if symptoms worsen or fail to improve.   Deeann Saint, MD

## 2022-08-18 ENCOUNTER — Encounter: Payer: Self-pay | Admitting: Gastroenterology

## 2022-08-18 ENCOUNTER — Ambulatory Visit (AMBULATORY_SURGERY_CENTER): Payer: Commercial Managed Care - PPO | Admitting: Gastroenterology

## 2022-08-18 VITALS — BP 107/74 | HR 60 | Temp 98.1°F | Resp 14 | Ht 71.0 in | Wt 198.0 lb

## 2022-08-18 DIAGNOSIS — K639 Disease of intestine, unspecified: Secondary | ICD-10-CM | POA: Diagnosis not present

## 2022-08-18 DIAGNOSIS — Z1211 Encounter for screening for malignant neoplasm of colon: Secondary | ICD-10-CM

## 2022-08-18 DIAGNOSIS — Z83719 Family history of colon polyps, unspecified: Secondary | ICD-10-CM

## 2022-08-18 DIAGNOSIS — D120 Benign neoplasm of cecum: Secondary | ICD-10-CM

## 2022-08-18 DIAGNOSIS — D123 Benign neoplasm of transverse colon: Secondary | ICD-10-CM

## 2022-08-18 MED ORDER — SODIUM CHLORIDE 0.9 % IV SOLN: 500.0000 mL | Freq: Once | INTRAVENOUS | Status: DC

## 2022-08-18 NOTE — Op Note (Signed)
[facility] Patient Name: Eric Haynes Procedure Date: 08/18/2022 1:18 PM MRN: [id number] Endoscopist: Lorin Picket E. Tomasa Rand , MD, [id number] Age: 42 Referring MD:  Date of Birth: August 19, 1980 Gender: Male Account #: [id number] Procedure:                Colonoscopy Indications:              Colon cancer screening in patient with 1st-degree                            relative having advanced adenoma of the colon                            before age 9 Medicines:                Monitored Anesthesia Care Procedure:                Pre-Anesthesia Assessment:                           - Prior to the procedure, a History and Physical                            was performed, and patient medications and                            allergies were reviewed. The patient's tolerance of                            previous anesthesia was also reviewed. The risks                            and benefits of the procedure and the sedation                            options and risks were discussed with the patient.                            All questions were answered, and informed consent                            was obtained. Prior Anticoagulants: The patient has                            taken no anticoagulant or antiplatelet agents. ASA                            Grade Assessment: II - A patient with mild systemic                            disease. After reviewing the risks and benefits,                            the patient was deemed in satisfactory condition to  undergo the procedure.                           After obtaining informed consent, the colonoscope                            was passed under direct vision. Throughout the                            procedure, the patient's blood pressure, pulse, and                            oxygen saturations were monitored continuously. The                            CF [id number] #[id number] was introduced through the  anus                            and advanced to the the terminal ileum, with                            identification of the appendiceal orifice and IC                            valve. The colonoscopy was performed without                            difficulty. The patient tolerated the procedure                            well. The quality of the bowel preparation was                            adequate. The terminal ileum, ileocecal valve,                            appendiceal orifice, and rectum were photographed.                            The bowel preparation used was SUTAB via split dose                            instruction. Scope In: 1:23:54 PM Scope Out: 1:43:12 PM Scope Withdrawal Time: 0 hours 15 minutes 27 seconds  Total Procedure Duration: 0 hours 19 minutes 18 seconds  Findings:                 The perianal and digital rectal examinations were                            normal. Pertinent negatives include normal                            sphincter tone and no palpable rectal lesions.  A localized area of mildly inflamed mucosa was                            found at the appendiceal orifice. Biopsies were                            taken with a cold forceps for histology. Estimated                            blood loss was minimal.                           A 6 mm polypoid lesion was found in the cecum. The                            polyp was sessile. The polyp was removed with a                            cold snare. Resection and retrieval were complete.                            Estimated blood loss was minimal.                           Three sessile and semi-pedunculated polyps were                            found in the distal transverse colon. The polyps                            were 2 to 3 mm in size. These polyps were removed                            with a cold snare. Resection and retrieval were                             complete. Estimated blood loss was minimal.                           The exam was otherwise normal throughout the                            examined colon.                           The terminal ileum appeared normal.                           The retroflexed view of the distal rectum and anal                            verge was normal and showed no anal or rectal  abnormalities. Complications:            No immediate complications. Estimated Blood Loss:     Estimated blood loss was minimal. Impression:               - Inflamed mucosa at the appendiceal orifice.                            Biopsied.                           - One 6 mm polyp in the cecum, removed with a cold                            snare. Resected and retrieved. This appeared to be                            inflammatory, rather than dysplastic.                           - Three 2 to 3 mm polyps in the distal transverse                            colon, removed with a cold snare. Resected and                            retrieved.                           - The examined portion of the ileum was normal.                           - The distal rectum and anal verge are normal on                            retroflexion view. Recommendation:           - Patient has a contact number available for                            emergencies. The signs and symptoms of potential                            delayed complications were discussed with the                            patient. Return to normal activities tomorrow.                            Written discharge instructions were provided to the                            patient.                           - Resume previous diet.                           -  Continue present medications.                           - Await pathology results.                           - Repeat colonoscopy (date not yet determined) for                             surveillance based on pathology results. Arlow Spiers E. Tomasa Rand, MD 08/18/2022 1:50:28 PM This report has been signed electronically.

## 2022-08-18 NOTE — Progress Notes (Signed)
Called to room to assist during endoscopic procedure.  Patient ID and intended procedure confirmed with present staff. Received instructions for my participation in the procedure from the performing physician.  

## 2022-08-18 NOTE — Progress Notes (Signed)
[facility] History and Physical   Primary Care Physician:  Madelin Headings, MD   Reason for Procedure:   Colon cancer screening, high risk  Plan:    Colonoscopy     HPI: Eric Haynes is a 42 y.o. male undergoing initial screening colonoscopy.  His brother was found to have a high risk polyp in his 3s and his gastroenterologist recommended his family members undergo early screening.  The patient has no family history of colon cancer and no chronic GI symptoms.    Past Medical History:  Diagnosis Date   CHICKENPOX, HX OF 03/30/2007   Qualifier: Diagnosis of  By: Lawernce Ion, CMA (AAMA), Carollee Herter S    HERPES LABIALIS, HX OF 03/30/2007   Qualifier: Diagnosis of  By: Fabian Sharp MD, Neta Mends     History reviewed. No pertinent surgical history.  Prior to Admission medications   Medication Sig Start Date End Date Taking? Authorizing Provider  Azelastine HCl 137 MCG/SPRAY SOLN Place 2 sprays into both nostrils 2 (two) times daily. 07/14/22   [provider]  fluticasone (FLONASE) 50 MCG/ACT nasal spray Place 2 sprays into both nostrils daily. 06/05/17 12/30/21  Panosh, Neta Mends, MD    Current Outpatient Medications  Medication Sig Dispense Refill   Azelastine HCl 137 MCG/SPRAY SOLN Place 2 sprays into both nostrils 2 (two) times daily.     fluticasone (FLONASE) 50 MCG/ACT nasal spray Place 2 sprays into both nostrils daily. 16 g prn   Current Facility-Administered Medications  Medication Dose Route Frequency Provider Last Rate Last Admin   0.9 %  sodium chloride infusion  500 mL Intravenous Once Jenel Lucks, MD        Allergies as of 08/18/2022 - Review Complete 08/18/2022  Allergen Reaction Noted   Amoxicillin  09/12/2008    Family History  Problem Relation Age of Onset   Diabetes Father    Colon polyps Brother     Social History   Socioeconomic History   Marital status: Married    Spouse name: Not on file   Number of children: 2   Years of education:  Not on file   Highest education level: Not on file  Occupational History   Not on file  Tobacco Use   Smoking status: Never   Smokeless tobacco: Never  Vaping Use   Vaping Use: Never used  Substance and Sexual Activity   Alcohol use: Yes   Drug use: No   Sexual activity: Yes  Other Topics Concern   Not on file  Social History Narrative   [employer]   Hh of 3 married  Toddler in day care.    No ets.    Social Determinants of Health   Financial Resource Strain: Not on file  Food Insecurity: Not on file  Transportation Needs: Not on file  Physical Activity: Not on file  Stress: Not on file  Social Connections: Not on file  Intimate Partner Violence: Not on file    Review of Systems:  All other review of systems negative except as mentioned in the HPI.  Physical Exam: Vital signs BP 119/79 (BP Location: Left Arm, Patient Position: Sitting, Cuff Size: Normal)   Pulse 70   Temp 98.1 F (36.7 C) (Temporal)   Ht 5\' 11"  (1.803 m)   Wt 198 lb (89.8 kg)   SpO2 99%   BMI 27.62 kg/m   General:   Alert,  Well-developed, well-nourished, pleasant and cooperative in NAD Airway:  Mallampati 2 Lungs:  Clear throughout  to auscultation.   Heart:  Regular rate and rhythm; no murmurs, clicks, rubs,  or gallops. Abdomen:  Soft, nontender and nondistended. Normal bowel sounds.   Neuro/Psych:  Normal mood and affect. A and O x 3   Kienna Moncada E. Candis Schatz, MD [facility]

## 2022-08-18 NOTE — Patient Instructions (Addendum)
Impression/Recommendations:  Poly handout given to patient.  Resume previous diet. Continue present medications. Await pathology results.  Repeat colonoscopy for surveillance.  Date to be determined based on pathology results.   YOU HAD AN ENDOSCOPIC PROCEDURE TODAY AT THE [facility]:   Refer to the procedure report that was given to you for any specific questions about what was found during the examination.  If the procedure report does not answer your questions, please call your gastroenterologist to clarify.  If you requested that your care partner not be given the details of your procedure findings, then the procedure report has been included in a sealed envelope for you to review at your convenience later.  YOU SHOULD EXPECT: Some feelings of bloating in the abdomen. Passage of more gas than usual.  Walking can help get rid of the air that was put into your GI tract during the procedure and reduce the bloating. If you had a lower endoscopy (such as a colonoscopy or flexible sigmoidoscopy) you may notice spotting of blood in your stool or on the toilet paper. If you underwent a bowel prep for your procedure, you may not have a normal bowel movement for a few days.  Please Note:  You might notice some irritation and congestion in your nose or some drainage.  This is from the oxygen used during your procedure.  There is no need for concern and it should clear up in a day or so.  SYMPTOMS TO REPORT IMMEDIATELY:  Following lower endoscopy (colonoscopy or flexible sigmoidoscopy):  Excessive amounts of blood in the stool  Significant tenderness or worsening of abdominal pains  Swelling of the abdomen that is new, acute  Fever of 100F or higher For urgent or emergent issues, a gastroenterologist can be reached at any hour by calling [phone]. Do not use MyChart messaging for urgent concerns.    DIET:  We do recommend a small meal at first, but then you may proceed to  your regular diet.  Drink plenty of fluids but you should avoid alcoholic beverages for 24 hours.  ACTIVITY:  You should plan to take it easy for the rest of today and you should NOT DRIVE or use heavy machinery until tomorrow (because of the sedation medicines used during the test).    FOLLOW UP: Our staff will call the number listed on your records the next business day following your procedure.  We will call around 7:15- 8:00 am to check on you and address any questions or concerns that you may have regarding the information given to you following your procedure. If we do not reach you, we will leave a message.     If any biopsies were taken you will be contacted by phone or by letter within the next 1-3 weeks.  Please call us at [phone] if you have not heard about the biopsies in 3 weeks.    SIGNATURES/CONFIDENTIALITY: You and/or your care partner have signed paperwork which will be entered into your electronic medical record.  These signatures attest to the fact that that the information above on your After Visit Summary has been reviewed and is understood.  Full responsibility of the confidentiality of this discharge information lies with you and/or your care-partner.

## 2022-08-18 NOTE — Progress Notes (Signed)
Report to PACU, RN, vss, BBS= Clear.  

## 2022-08-19 ENCOUNTER — Telehealth: Payer: Self-pay

## 2022-08-19 NOTE — Telephone Encounter (Signed)
  Follow up Call-     08/18/2022   12:37 PM 08/18/2022   12:34 PM  Call back number  Post procedure Call Back phone  # [phone]   Permission to leave phone message  Yes     Patient questions:  Do you have a fever, pain , or abdominal swelling? No. Pain Score  0 *  Have you tolerated food without any problems? Yes.    Have you been able to return to your normal activities? Yes.    Do you have any questions about your discharge instructions: Diet   No. Medications  No. Follow up visit  No.  Do you have questions or concerns about your Care? No.  Actions: * If pain score is 4 or above: No action needed, pain <4.

## 2022-08-21 NOTE — Progress Notes (Signed)
Eric Haynes,  The biopsies of the localized inflamed mucosa at your appendix showed non-specific inflammatory changes.  The erythematous polypoid lesion in your cecum also showed non-specific reactive changes.  Given the very focal distribution of these changes and the lack of worrisome symptoms, I do not think that any further evaluation needs to be done at this time.  If, however, you were to develop symptoms such as persistent abdominal pain, diarrhea or blood stools, we would need to do further testing.  Please contact me via MyChart if you develop any concerning persistent GI symptoms.  The other polypoid lesions removed were not precancerous.  As discussed, based on your family history of advanced polyps at a young age, I think it is reasonable to follow a close surveillance strategy and repeat colonoscopy in 5 years.

## 2022-12-15 ENCOUNTER — Ambulatory Visit: Payer: Commercial Managed Care - PPO

## 2022-12-16 ENCOUNTER — Ambulatory Visit: Payer: Commercial Managed Care - PPO

## 2023-03-30 ENCOUNTER — Ambulatory Visit (INDEPENDENT_AMBULATORY_CARE_PROVIDER_SITE_OTHER): Payer: Commercial Managed Care - PPO

## 2023-03-30 DIAGNOSIS — Z23 Encounter for immunization: Secondary | ICD-10-CM | POA: Diagnosis not present

## 2023-11-04 ENCOUNTER — Ambulatory Visit (INDEPENDENT_AMBULATORY_CARE_PROVIDER_SITE_OTHER)

## 2023-11-04 DIAGNOSIS — Z23 Encounter for immunization: Secondary | ICD-10-CM | POA: Diagnosis not present
